# Patient Record
Sex: Female | Born: 1945 | Race: White | Hispanic: No | Marital: Married | Smoking: Former smoker
Health system: Southern US, Community
[De-identification: ages and names within clinical notes are randomized; demographics above are authoritative.]

## PROBLEM LIST (undated history)

## (undated) DIAGNOSIS — M810 Age-related osteoporosis without current pathological fracture: Secondary | ICD-10-CM

## (undated) DIAGNOSIS — J479 Bronchiectasis, uncomplicated: Secondary | ICD-10-CM

## (undated) DIAGNOSIS — E039 Hypothyroidism, unspecified: Secondary | ICD-10-CM

## (undated) DIAGNOSIS — F418 Other specified anxiety disorders: Secondary | ICD-10-CM

## (undated) DIAGNOSIS — G4700 Insomnia, unspecified: Secondary | ICD-10-CM

## (undated) HISTORY — DX: Insomnia, unspecified: G47.00

## (undated) HISTORY — PX: TUBAL LIGATION: SHX77

## (undated) HISTORY — DX: Other specified anxiety disorders: F41.8

## (undated) HISTORY — DX: Hypothyroidism, unspecified: E03.9

## (undated) HISTORY — DX: Bronchiectasis, uncomplicated: J47.9

## (undated) HISTORY — PX: THYROIDECTOMY: SHX17

## (undated) HISTORY — DX: Age-related osteoporosis without current pathological fracture: M81.0

---

## 1999-01-31 HISTORY — PX: SPLENECTOMY: SUR1306

## 1999-07-20 ENCOUNTER — Encounter (INDEPENDENT_AMBULATORY_CARE_PROVIDER_SITE_OTHER): Payer: Self-pay | Admitting: Specialist

## 1999-07-20 ENCOUNTER — Inpatient Hospital Stay (HOSPITAL_COMMUNITY): Admission: RE | Admit: 1999-07-20 | Discharge: 1999-07-22 | Payer: Self-pay | Admitting: Surgery

## 2007-11-14 ENCOUNTER — Ambulatory Visit: Payer: Self-pay | Admitting: Internal Medicine

## 2007-11-14 DIAGNOSIS — J479 Bronchiectasis, uncomplicated: Secondary | ICD-10-CM | POA: Insufficient documentation

## 2007-11-14 DIAGNOSIS — E039 Hypothyroidism, unspecified: Secondary | ICD-10-CM | POA: Insufficient documentation

## 2007-12-17 ENCOUNTER — Ambulatory Visit: Payer: Self-pay | Admitting: Internal Medicine

## 2010-06-17 NOTE — Consult Note (Signed)
[facility]  Patient:    Madison Cox, Madison Cox                         MRN: [id number] Proc. Date: 07/20/99 Attending:  Genene Churn. Cyndie Chime, M.D. CC:         Sandria Bales. Ezzard Standing, M.D.             Dr. Wende Crease, Jonita [place], [place]                          Consultation Report  HISTORY OF PRESENT ILLNESS:  A pleasant 65 year old woman I follow in my [place], [place], office since September of 2000.  She was found to have a low platelet count in January of 2000.  She underwent evaluation by her primary care physician with a bone marrow biopsy on February 12, 1998.  Bone marrow findings were normal and consistent with a diagnosis of ITP.  Platelet count at that time was 7000. She had a prompt response to steroids initially with pc up to 200,000 by February 22, 1998.  However, subsequently she has not been able to get off steroids.  Every time steroids have tapered, platelet count falls.  When she saw me in September of 2000, I added Danazol to the steroids hoping that they would allow me to taper the steroids completely.  Over the last nine months, despite fluctuations of her counts into the normal range I have still not been able to get her completely off steroids.  She has become cushingoid. She is referred at this time for an elective splenectomy.  Current steroid dose is prednisone 20 mg daily and Danazol dose is 200 mg daily.  Most recent CBC done in the [facility] office showed a hemoglobin of 15, hematocrit 46, white count 8.100 and platelet count of 88,000.  In anticipation that surgery was going to be necessary, she has also received Pneumovax, Haemophilus influenzae, and meningococcus vaccines in January of this year.  PAST MEDICAL HISTORY:  This includes Graves disease diagnosed at age 74, initially treated with PTU.  Subsequent subtotal thyroidectomy at [facility] in 1982.  Radioactive iodine treatments given due to regrowth of the thyroid  remnant.  No history of collagen vascular disorder.  MEDICATIONS:  As noted above.  She also takes Synthroid 0.112 mg daily.  FAMILY HISTORY:  Mother has inflammatory arthritis.  A daughter, age 82, developed juvenile diabetes at the time of pregnancy which did not resolve after the pregnancy.  SOCIAL HISTORY:  She is married.  No alcohol use.  She has two children; daughter age 75 with diabetes and son, age 21, who is healthy.  IMPRESSION: 1. 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 2. History of autoimmune thyroiditis.  PLAN:  Proceed with splenectomy.  She should receive stress dose steroids for the first 48 hours.  I would give her Solu-Medrol 60 to 80 mg daily x 3 days and resume her prednisone at 20 mg and then taper the prednisone over the next few weeks in view of her chronic prednisone use.  As noted above, she has received all requisite vaccines back in January.  Thank you for this consultation. DD:  07/20/99 TD:  07/20/99 Job: [id number]

## 2010-06-17 NOTE — Discharge Summary (Signed)
[facility]  Patient:    Madison Cox, Madison Cox                       MRN: [id number] Adm. Date:  [id number] Disc. Date: [id number] Attending:  Andre Lefort CC:         Genene Churn. Cyndie Chime, M.D.  Barbera Setters. Doyne Keel, M.D., [place], [place]   Discharge Summary  DATE OF BIRTH:                [birth year]  DISCHARGE DIAGNOSES:          1. Idiopathic thrombocytopenia.                               2. Autoimmune thyroiditis.  OPERATIONS:                   The patient had a laparoscopic splenectomy on July 20, 1999.  HISTORY OF PRESENT ILLNESS:   Ms. Damico is a 65 year old white female who is followed by Genene Churn. Cyndie Chime, M.D.  She was found to have a low platelet count in January of 2000 and underwent evaluation, which included a bone marrow biopsy on July 14, 1999.  The bone marrow biopsy was normal and consistent with a diagnosis of ITP.  The platelet count at that time was 7000. The patient had a prompt response to steroids where the counts of her platelets got up to as high as 200,000.  However, she since has been unable to be tapered off of steroids.  Every time her steroids have been tapered, her platelet count has fallen dramatically.  Genene Churn. [place]a, M.D., attempted danazol in addition to steroids, but he has continued to be unsuccessful about weaning her off steroids.  She has developed increasing cushingoid features secondary to the steroids. Her admission dose of steroids was prednisone 20 mg daily and danazol 200 mg daily.  Her platelet count was 88,000.  It was felt that she would be best served proceeding with a splenectomy in view of her continued use of steroids and the side effects of these.  She has undergone Pneumovax, Haemophilus influenzae, and meningococcal vaccines in January of 2001 in anticipation of surgery.  PAST MEDICAL HISTORY:  Significant in that she has a Graves disease diagnosed at the age of 51, treated initially with  PTU and subsequently requiring a subtotal thyroidectomy and later radioactive iodine treatment for regrowth of her thyroid tissue remnant.  The remainder of her past medical history is really noncontributory.  PHYSICAL EXAMINATION:  Her abdomen was basically benign with no abdominal mass or splenomegaly.  No evidence of abdominal wall hernia.  HOSPITAL COURSE:  The patient was taken to the operating room on the day of admission where she underwent a laparoscopic splenectomy.  That evening after surgery, she was doing well.  Her hemoglobin was stable.  Her NG tube was removed.  The following, she was started on liquids.  Her serum amylase was 155.  Her platelet count was 156,000 and hemoglobin 12.6.  By the second postoperative day, she was afebrile and her abdomen was soft.  She was scheduled on a taper of her steroids and understood this well.  Her final pathology showed disrupted splenic tissue with hyperplasia of the white pulp, clinically idiopathic thrombocytopenic purpura.  DISCHARGE MEDICATIONS:  She was given Vicodin for pain.  ACTIVITY:  She is to do no driving or heavy lifting  for three or four days.  FOLLOW-UP:  She is to see me back in two weeks.  SPECIAL INSTRUCTIONS:  Genene Churn. Cyndie Chime, M.D., outlined a steroid taper program, which was written out on her discharge instruction sheet, and she was to stop her danazol.  CONDITION ON DISCHARGE:  Good. DD:  10/12/99 TD:  10/14/99 Job: [id number]

## 2012-01-31 HISTORY — PX: COLONOSCOPY: SHX174

## 2016-06-07 ENCOUNTER — Encounter: Payer: Self-pay | Admitting: Hematology

## 2016-07-06 ENCOUNTER — Encounter: Payer: Self-pay | Admitting: Hematology

## 2016-07-06 ENCOUNTER — Ambulatory Visit (HOSPITAL_BASED_OUTPATIENT_CLINIC_OR_DEPARTMENT_OTHER): Payer: Medicare Other

## 2016-07-06 ENCOUNTER — Telehealth: Payer: Self-pay | Admitting: Hematology

## 2016-07-06 ENCOUNTER — Other Ambulatory Visit (HOSPITAL_COMMUNITY)
Admission: RE | Admit: 2016-07-06 | Discharge: 2016-07-06 | Disposition: A | Discharge status: Home / Self Care | Payer: Medicare Other | Source: Ambulatory Visit | Attending: Hematology | Admitting: Hematology

## 2016-07-06 ENCOUNTER — Ambulatory Visit (HOSPITAL_BASED_OUTPATIENT_CLINIC_OR_DEPARTMENT_OTHER): Payer: Medicare Other | Admitting: Hematology

## 2016-07-06 VITALS — BP 117/65 | HR 63 | Temp 97.7°F | Resp 18 | Wt 110.1 lb

## 2016-07-06 DIAGNOSIS — D473 Essential (hemorrhagic) thrombocythemia: Secondary | ICD-10-CM | POA: Diagnosis not present

## 2016-07-06 DIAGNOSIS — D72820 Lymphocytosis (symptomatic): Secondary | ICD-10-CM | POA: Diagnosis not present

## 2016-07-06 DIAGNOSIS — Z862 Personal history of diseases of the blood and blood-forming organs and certain disorders involving the immune mechanism: Secondary | ICD-10-CM | POA: Diagnosis not present

## 2016-07-06 DIAGNOSIS — D75839 Thrombocytosis, unspecified: Secondary | ICD-10-CM

## 2016-07-06 LAB — COMPREHENSIVE METABOLIC PANEL
ALBUMIN: 3.6 g/dL (ref 3.5–5.0)
ALK PHOS: 104 U/L (ref 40–150)
ALT: 16 U/L (ref 0–55)
AST: 25 U/L (ref 5–34)
Anion Gap: 6 mEq/L (ref 3–11)
BILIRUBIN TOTAL: 0.38 mg/dL (ref 0.20–1.20)
BUN: 10.9 mg/dL (ref 7.0–26.0)
CO2: 29 mEq/L (ref 22–29)
CREATININE: 0.9 mg/dL (ref 0.6–1.1)
Calcium: 9.8 mg/dL (ref 8.4–10.4)
Chloride: 104 mEq/L (ref 98–109)
EGFR: 65 mL/min/{1.73_m2} — ABNORMAL LOW (ref >90)
Glucose: 80 mg/dl (ref 70–140)
Potassium: 4.9 mEq/L (ref 3.5–5.1)
SODIUM: 139 meq/L (ref 136–145)
TOTAL PROTEIN: 6.9 g/dL (ref 6.4–8.3)

## 2016-07-06 LAB — CBC & DIFF AND RETIC
BASO%: 0.6 % (ref 0.0–2.0)
Basophils Absolute: 0.1 10*3/uL (ref 0.0–0.1)
EOS ABS: 0.3 10*3/uL (ref 0.0–0.5)
EOS%: 2.6 % (ref 0.0–7.0)
HCT: 41.4 % (ref 34.8–46.6)
HEMOGLOBIN: 13.3 g/dL (ref 11.6–15.9)
IMMATURE RETIC FRACT: 2.8 % (ref 1.60–10.00)
LYMPH#: 5.7 10*3/uL — AB (ref 0.9–3.3)
LYMPH%: 59.4 % — ABNORMAL HIGH (ref 14.0–49.7)
MCH: 28.5 pg (ref 25.1–34.0)
MCHC: 32.1 g/dL (ref 31.5–36.0)
MCV: 88.8 fL (ref 79.5–101.0)
MONO#: 0.6 10*3/uL (ref 0.1–0.9)
MONO%: 5.7 % (ref 0.0–14.0)
NEUT%: 31.7 % — AB (ref 38.4–76.8)
NEUTROS ABS: 3 10*3/uL (ref 1.5–6.5)
PLATELETS: 373 10*3/uL (ref 145–400)
RBC: 4.66 10*6/uL (ref 3.70–5.45)
RDW: 16.2 % — ABNORMAL HIGH (ref 11.2–14.5)
RETIC CT ABS: 25.16 10*3/uL — AB (ref 33.70–90.70)
Retic %: 0.54 % — ABNORMAL LOW (ref 0.70–2.10)
WBC: 9.6 10*3/uL (ref 3.9–10.3)

## 2016-07-06 LAB — CHCC SMEAR

## 2016-07-06 LAB — LACTATE DEHYDROGENASE: LDH: 172 U/L (ref 125–245)

## 2016-07-06 NOTE — Telephone Encounter (Signed)
Gave patient AVS and calender per 6/7 los . 

## 2016-07-06 NOTE — Progress Notes (Signed)
[facility]    HEMATOLOGY/ONCOLOGY CONSULTATION NOTE  Date of Service: 07/06/2016  Patient Care Team: Glenda Chroman, MD as PCP - General (Internal Medicine)  CHIEF COMPLAINTS/PURPOSE OF CONSULTATION:  Thrombocytosis  HISTORY OF PRESENTING ILLNESS:   Madison Cox is a wonderful 71 y.o. female who has been referred to [place] by Dr .Woody Seller, Costella Hatcher, MD  for evaluation and management of thrombocytosis.  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 He reasonably groomed laboratory primary care physician on 05/27/2016 which showed a normal hemoglobin of 13.5 with an MCV of 86. He was noted to have elevated platelet count of 717k and a borderline elevated WBC count of 11.8k with lymphocyte count of 5.6k. Labs prior to these on 04/20/2016 showed a platelet count of 601 k and lymphocyte count of 6.6 k CBC on day/06/2016 showed a platelet count of 467 k with lymphocyte count of 6.1 k and monocyte count of 1.3 k.  Previous CBC on 03/24/2015 show normal platelet count of 367 k with lymphocyte count of 4.9k. And a platelet count of 415k in February 2016. Platelet count of 526k in February 2015.  Patient has no history of DVT, MI or stroke . No current smoking.   no acute infections or recent surgical stressors.   no fevers no chills no night sweats. No clinical evidence of an enlarged lymph nodes. No shortness of breath no chest pain no abdominal pain or distention. No significant unexpected weight loss.      MEDICAL HISTORY:   #1 history of ITP 13 years ago -treated with steroids and splenectomy. #2 history of Graves' disease status post thyroidectomy and radioactive iodine 1 - now hypothyroid on levothyroxine replacement. #3 depression #4 anxiety #5 asthmatic bronchitis #6 osteoporosis  SURGICAL HISTORY:  #1 splenectomy 13 years ago #2  thyroidectomy #3 tubal ligation  SOCIAL HISTORY: Social History   Social History  . Marital status: Married    Spouse name: N/A  . Number of children: N/A  . Years of education: N/A   Occupational History  . Not on file.   Social History Main Topics  . Smoking status: Not on file  . Smokeless tobacco: Not on file  . Alcohol use Not on file  . Drug use: Unknown  . Sexual activity: Not on file   Other Topics Concern  . Not on file   Social History Narrative  . No narrative on file  Retired from [employer] with spouse, widowed, remarried Quit tobacco use in 2005 No significant alcohol use No drug use  FAMILY HISTORY:  Father-kidney cancer Daughter-diabetes type 2 No family history of blood disorders or cancers.  ALLERGIES:  has no allergies on file.  MEDICATIONS:  Current Outpatient Prescriptions  Medication Sig Dispense Refill  . ALPRAZolam (XANAX) 0.5 MG tablet Take 0.5 mg by mouth 2 (two) times daily as needed.  1  . Calcium Carb-Cholecalciferol (CALCIUM 1000 + D PO) Take by mouth.    [facility] Kitchen FLUoxetine (PROZAC) 20 MG capsule TAKE 2 CAPSULES BY MOUTH DAILY FOR DEPRESSION.  12  . ibandronate (BONIVA) 150 MG tablet TAKE 1 TABLET BY MOUTH EVERY MONTH  12  . levothyroxine (SYNTHROID, LEVOTHROID) 88 MCG tablet Take 88 mcg by mouth daily.  2  . Multiple Vitamins-Minerals (MULTIVITAMIN ADULT PO) Take by mouth.     No current facility-administered medications for this visit.  REVIEW OF SYSTEMS:    10 Point review of Systems was done is negative except as noted above.  PHYSICAL EXAMINATION: ECOG PERFORMANCE STATUS: 1 - Symptomatic but completely ambulatory  . Vitals:   07/06/16 1248  BP: 117/65  Pulse: 63  Resp: 18  Temp: 97.7 F (36.5 C)   Filed Weights   07/06/16 1248  Weight: 110 lb 1.6 oz (49.9 kg)   .There is no height or weight on file to calculate BMI.  GENERAL:alert, in no acute distress and comfortable SKIN: no acute rashes, no  significant lesions EYES: conjunctiva are pink and non-injected, sclera anicteric OROPHARYNX: MMM, no exudates, no oropharyngeal erythema or ulceration NECK: supple, no JVD LYMPH:  no palpable lymphadenopathy in the cervical, axillary or inguinal regions LUNGS: clear to auscultation b/l with normal respiratory effort HEART: regular rate & rhythm ABDOMEN:  normoactive bowel sounds , non tender, not distended. No palpable hepatomegaly Extremity: no pedal edema PSYCH: alert & oriented x 3 with fluent speech NEURO: no focal motor/sensory deficits  LABORATORY DATA:  I have reviewed the data as listed  . CBC Latest Ref Rng & Units 07/06/2016  WBC 3.9 - 10.3 10e3/uL 9.6  Hemoglobin 11.6 - 15.9 g/dL 13.3  Hematocrit 34.8 - 46.6 % 41.4  Platelets 145 - 400 10e3/uL 373   . CBC    Component Value Date/Time   WBC 9.6 07/06/2016 1337   RBC 4.66 07/06/2016 1337   HGB 13.3 07/06/2016 1337   HCT 41.4 07/06/2016 1337   PLT 373 07/06/2016 1337   MCV 88.8 07/06/2016 1337   MCH 28.5 07/06/2016 1337   MCHC 32.1 07/06/2016 1337   RDW 16.2 (H) 07/06/2016 1337   LYMPHSABS 5.7 (H) 07/06/2016 1337   MONOABS 0.6 07/06/2016 1337   EOSABS 0.3 07/06/2016 1337   BASOSABS 0.1 07/06/2016 1337    . CMP Latest Ref Rng & Units 07/06/2016  Glucose 70 - 140 mg/dl 80  BUN 7.0 - 26.0 mg/dL 10.9  Creatinine 0.6 - 1.1 mg/dL 0.9  Sodium 136 - 145 mEq/L 139  Potassium 3.5 - 5.1 mEq/L 4.9  CO2 22 - 29 mEq/L 29  Calcium 8.4 - 10.4 mg/dL 9.8  Total Protein 6.4 - 8.3 g/dL 6.9  Total Bilirubin 0.20 - 1.20 mg/dL 0.38  Alkaline Phos 40 - 150 U/L 104  AST 5 - 34 U/L 25  ALT 0 - 55 U/L 16     RADIOGRAPHIC STUDIES: I have personally reviewed the radiological images as listed and agreed with the findings in the report. No results found.  ASSESSMENT & PLAN:   71 yo female with   1) H/o ITP  status post splenectomy in June 2001 .  2) Chronic variably elevated platelet counts/Thrombocytosis for several year.  This appears to be likely related to the patient's post splenectomy status. Less likely to be a myeloproliferative neoplasm such as essential thrombocytosis But would rule this out. Associated mild lymphocytosis and at times monocytosis is consistent with her post-splenectomy status.  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 Rule out clonal lymphocytosis. Lymphocyte count of 5.7k Plan -Potential etiologies of the patient's thrombocytosis were discussed. We discussed that the most likely etiology would be her post-splenectomy status. -will get rpt labs today. CBC done today show nl platelet count of 373K -genetics markers for clonal thrombocytosis were sent out. -flow cytometry on peripheral blood to evaluate for clonal lymphocytosis.  Labs today RTC with Dr Irene Limbo in 2 weeks   . Orders Placed This Encounter  Procedures  . CBC & Diff and Retic    Standing Status:   Future    Number of Occurrences:   1    Standing Expiration Date:   07/06/2017  . Comprehensive metabolic panel    Standing Status:   Future    Number of Occurrences:   1    Standing Expiration Date:   07/06/2017  . Lactate dehydrogenase    Standing Status:   Future    Number of Occurrences:   1    Standing Expiration Date:   07/06/2017  . Flow Cytometry    Lymphocytosis r/o clonal lymphoproliferative disorder    Standing Status:   Future    Number of Occurrences:   1    Standing Expiration Date:   07/06/2017  . JAK2 (including V617F and Exon 12), MPL, and CALR-Next Generation Sequencing    Standing Status:   Future    Number of Occurrences:   1    Standing Expiration Date:   07/06/2017  . Smear    Standing Status:   Future    Number of Occurrences:   1    Standing Expiration Date:   07/06/2017     All of the patients questions were answered with apparent satisfaction. The patient knows to call the clinic with any problems, questions or concerns.  I spent 40 minutes counseling the patient face to  face. The total time spent in the appointment was 60 minutes and more than 50% was on counseling and direct patient cares.    Sullivan Lone MD [place] AAHIVMS [facility] Hematology/Oncology Physician [facility]  (Office):       [phone] (Work cell):  [phone] (Fax):           [phone]  07/06/2016 12:56 PM

## 2016-07-06 NOTE — Patient Instructions (Signed)
Thank you for choosing [facility] to provide your oncology and hematology care.  To afford each patient quality time with our providers, please arrive 30 minutes before your scheduled appointment time.  If you arrive late for your appointment, you may be asked to reschedule.  We strive to give you quality time with our providers, and arriving late affects you and other patients whose appointments are after yours.   If you are a no show for multiple scheduled visits, you may be dismissed from the clinic at the providers discretion.    Again, thank you for choosing [facility], our hope is that these requests will decrease the amount of time that you wait before being seen by our physicians.  ______________________________________________________________________  Should you have questions after your visit to the [facility], please contact our office at [phone] between the hours of 8:30 and 4:30 p.m.    Voicemails left after 4:30p.m will not be returned until the following business day.    For prescription refill requests, please have your pharmacy contact us directly.  Please also try to allow 48 hours for prescription requests.    Please contact the scheduling department for questions regarding scheduling.  For scheduling of procedures such as PET scans, CT scans, MRI, Ultrasound, etc please contact central scheduling at [phone].    Resources For Cancer Patients and Caregivers:   Oncolink.org:  A wonderful resource for patients and healthcare providers for information regarding your disease, ways to tract your treatment, what to expect, etc.     American Cancer Society:  800-227-2345  Can help patients locate various types of support and financial assistance  Cancer Care: 1-800-813-HOPE (4673) Provides financial assistance, online support groups, medication/co-pay assistance.    [place] DSS:  336-641-3447 Where to apply for food  stamps, Medicaid, and utility assistance  Medicare Rights Center: 800-333-4114 Helps people with Medicare understand their rights and benefits, navigate the Medicare system, and secure the quality healthcare they deserve  SCAT: 336-333-6589 [place] [employer]'s shared-ride transportation service for eligible riders who have a disability that prevents them from riding the fixed route bus.    For additional information on assistance programs please contact our social worker:   Grier Hock/Abigail Elmore:  [phone]            

## 2016-07-10 LAB — FLOW CYTOMETRY

## 2016-07-18 ENCOUNTER — Ambulatory Visit (HOSPITAL_BASED_OUTPATIENT_CLINIC_OR_DEPARTMENT_OTHER): Payer: Medicare Other | Admitting: Hematology

## 2016-07-18 ENCOUNTER — Telehealth: Payer: Self-pay | Admitting: Hematology

## 2016-07-18 VITALS — BP 91/55 | HR 59 | Temp 97.5°F | Resp 18 | Wt 110.6 lb

## 2016-07-18 DIAGNOSIS — D72820 Lymphocytosis (symptomatic): Secondary | ICD-10-CM | POA: Diagnosis not present

## 2016-07-18 DIAGNOSIS — Z862 Personal history of diseases of the blood and blood-forming organs and certain disorders involving the immune mechanism: Secondary | ICD-10-CM

## 2016-07-18 DIAGNOSIS — D75839 Thrombocytosis, unspecified: Secondary | ICD-10-CM

## 2016-07-18 DIAGNOSIS — D473 Essential (hemorrhagic) thrombocythemia: Secondary | ICD-10-CM

## 2016-07-18 NOTE — Telephone Encounter (Signed)
No additional appts added per 6/19 los. -RTC with Dr Irene Limbo on an as needed basis.

## 2016-08-28 NOTE — Progress Notes (Signed)
Marland Kitchen    HEMATOLOGY/ONCOLOGY CLINIC NOTE  Date of Service: .07/18/2016  Patient Care Team: Glenda Chroman, MD as PCP - General (Internal Medicine)  CHIEF COMPLAINTS/PURPOSE OF CONSULTATION:  Thrombocytosis  HISTORY OF PRESENTING ILLNESS:   Madison Cox is a wonderful 71 y.o. female who has been referred to [place] by Dr .Woody Seller, Costella Hatcher, MD  for evaluation and management of thrombocytosis.  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 He reasonably groomed laboratory primary care physician on 05/27/2016 which showed a normal hemoglobin of 13.5 with an MCV of 86. He was noted to have elevated platelet count of 717k and a borderline elevated WBC count of 11.8k with lymphocyte count of 5.6k. Labs prior to these on 04/20/2016 showed a platelet count of 601 k and lymphocyte count of 6.6 k CBC on day/06/2016 showed a platelet count of 467 k with lymphocyte count of 6.1 k and monocyte count of 1.3 k.  Previous CBC on 03/24/2015 show normal platelet count of 367 k with lymphocyte count of 4.9k. And a platelet count of 415k in February 2016. Platelet count of 526k in February 2015.  Patient has no history of DVT, MI or stroke . No current smoking.   no acute infections or recent surgical stressors.   no fevers no chills no night sweats. No clinical evidence of an enlarged lymph nodes. No shortness of breath no chest pain no abdominal pain or distention. No significant unexpected weight loss.     INTERVAL HISTORY  Patient is for follow-up to review the results of her workup for thrombocytopenia and lymphocytosis. She notes no acute new symptoms. No fevers no chills no night sweats. Lab results were discussed in details and all her questions were answered to her apparent satisfaction.  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 #2 history of Graves' disease status post thyroidectomy and radioactive iodine 1 - now hypothyroid on levothyroxine replacement. #3 depression #4 anxiety #5 asthmatic bronchitis #6 osteoporosis  SURGICAL HISTORY:  #1 splenectomy 13 years ago #2 thyroidectomy #3 tubal ligation  SOCIAL HISTORY: Social History   Social History  . Marital status: Married    Spouse name: N/A  . Number of children: N/A  . Years of education: N/A   Occupational History  . Not on file.   Social History Main Topics  . Smoking status: Former Smoker    Types: Cigarettes    Quit date: 07/07/2003  . Smokeless tobacco: Never Used  . Alcohol use No  . Drug use: No  . Sexual activity: Not on file   Other Topics Concern  . Not on file   Social History Narrative  . No narrative on file  Retired from [employer] with spouse, widowed, remarried Quit tobacco use in 2005 No significant alcohol use No drug use  FAMILY HISTORY:  Father-kidney cancer Daughter-diabetes type 2 No family history of blood disorders or cancers.  ALLERGIES:  has No Known Allergies.  MEDICATIONS:  Current Outpatient Prescriptions  Medication Sig Dispense Refill  . ALPRAZolam (XANAX) 0.5 MG tablet Take 0.5 mg by mouth 2 (two) times daily as needed.  1  . Calcium Carb-Cholecalciferol (CALCIUM 1000 + D PO) Take by mouth.    Marland Kitchen FLUoxetine (PROZAC) 20 MG capsule TAKE 2 CAPSULES BY MOUTH DAILY FOR DEPRESSION.  12  .  ibandronate (BONIVA) 150 MG tablet TAKE 1 TABLET BY MOUTH EVERY MONTH  12  . levothyroxine (SYNTHROID, LEVOTHROID) 88 MCG tablet Take 88 mcg by mouth daily.  2  . Multiple Vitamins-Minerals (MULTIVITAMIN ADULT PO) Take by mouth.     No current facility-administered medications for this visit.     REVIEW OF SYSTEMS:    10 Point review of Systems was done is negative except as noted above.  PHYSICAL EXAMINATION: ECOG PERFORMANCE STATUS: 1 - Symptomatic but  completely ambulatory  . Vitals:   07/18/16 1410  BP: (!) 91/55  Pulse: (!) 59  Resp: 18  Temp: 97.5 F (36.4 C)   Filed Weights   07/18/16 1410  Weight: 110 lb 9.6 oz (50.2 kg)   .There is no height or weight on file to calculate BMI.  GENERAL:alert, in no acute distress and comfortable SKIN: no acute rashes, no significant lesions EYES: conjunctiva are pink and non-injected, sclera anicteric OROPHARYNX: MMM, no exudates, no oropharyngeal erythema or ulceration NECK: supple, no JVD LYMPH:  no palpable lymphadenopathy in the cervical, axillary or inguinal regions LUNGS: clear to auscultation b/l with normal respiratory effort HEART: regular rate & rhythm ABDOMEN:  normoactive bowel sounds , non tender, not distended. No palpable hepatomegaly Extremity: no pedal edema PSYCH: alert & oriented x 3 with fluent speech NEURO: no focal motor/sensory deficits  LABORATORY DATA:  I have reviewed the data as listed  . CBC Latest Ref Rng & Units 07/06/2016  WBC 3.9 - 10.3 10e3/uL 9.6  Hemoglobin 11.6 - 15.9 g/dL 13.3  Hematocrit 34.8 - 46.6 % 41.4  Platelets 145 - 400 10e3/uL 373   . CBC    Component Value Date/Time   WBC 9.6 07/06/2016 1337   RBC 4.66 07/06/2016 1337   HGB 13.3 07/06/2016 1337   HCT 41.4 07/06/2016 1337   PLT 373 07/06/2016 1337   MCV 88.8 07/06/2016 1337   MCH 28.5 07/06/2016 1337   MCHC 32.1 07/06/2016 1337   RDW 16.2 (H) 07/06/2016 1337   LYMPHSABS 5.7 (H) 07/06/2016 1337   MONOABS 0.6 07/06/2016 1337   EOSABS 0.3 07/06/2016 1337   BASOSABS 0.1 07/06/2016 1337    . CMP Latest Ref Rng & Units 07/06/2016  Glucose 70 - 140 mg/dl 80  BUN 7.0 - 26.0 mg/dL 10.9  Creatinine 0.6 - 1.1 mg/dL 0.9  Sodium 136 - 145 mEq/L 139  Potassium 3.5 - 5.1 mEq/L 4.9  CO2 22 - 29 mEq/L 29  Calcium 8.4 - 10.4 mg/dL 9.8  Total Protein 6.4 - 8.3 g/dL 6.9  Total Bilirubin 0.20 - 1.20 mg/dL 0.38  Alkaline Phos 40 - 150 U/L 104  AST 5 - 34 U/L 25  ALT 0 - 55 U/L 16        RADIOGRAPHIC STUDIES: I have personally reviewed the radiological images as listed and agreed with the findings in the report. No results found.  ASSESSMENT & PLAN:   71 yo female with   1) H/o ITP  status post splenectomy in June 2001 .  2) Chronic variably elevated platelet counts/Thrombocytosis for several years. Thisis likely related to the patient's post splenectomy status. Testing for clonal thrombocytosis including JAK2 (including V617F and Exon 12), MPL, and CALR was negative ruling out the possibility of Essential thrombocytosis.  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  Lymphocyte count of 5.7k Flow cytometry - did not show an overt clonal lymphocyte population. Plan -all the lab results were discussed in details with the patient  and all her questions were answered . -Findings are consistent with reactive thrombocytosis and mild chronic lymphocytosis related to post splenectomy status -Reasonable to continue aspirin 81 mg by mouth daily . -No other additional workup or therapeutic intervention recommended at this time . -We discussed that the platelet counts currently are in the normal range. They're likely to increase excessively with surgeries infections or other reactive stimuli . -Continue follow-up with primary care physician .    RTC with Dr Irene Limbo on an as-needed basis if any new questions or concerns arise.  No orders of the defined types were placed in this encounter.    All of the patients questions were answered with apparent satisfaction. The patient knows to call the clinic with any problems, questions or concerns.  I spent 20 minutes counseling the patient face to face. The total time spent in the appointment was 25 minutes and more than 50% was on counseling and direct patient cares.    Sullivan Lone MD [place] AAHIVMS [facility] Hematology/Oncology Physician [facility]  (Office):       [phone] (Work  cell):  [phone] (Fax):           [phone]

## 2018-11-27 ENCOUNTER — Ambulatory Visit (INDEPENDENT_AMBULATORY_CARE_PROVIDER_SITE_OTHER): Payer: Medicare Other

## 2018-11-27 ENCOUNTER — Institutional Professional Consult (permissible substitution): Payer: Medicare Other | Admitting: Internal Medicine

## 2018-11-27 ENCOUNTER — Ambulatory Visit: Payer: Medicare Other | Admitting: Pulmonary Disease

## 2018-11-27 ENCOUNTER — Other Ambulatory Visit: Payer: Self-pay

## 2018-11-27 ENCOUNTER — Encounter: Payer: Self-pay | Admitting: Pulmonary Disease

## 2018-11-27 VITALS — BP 116/64 | HR 69 | Temp 97.3°F | Ht 62.0 in | Wt 116.2 lb

## 2018-11-27 DIAGNOSIS — J479 Bronchiectasis, uncomplicated: Secondary | ICD-10-CM

## 2018-11-27 LAB — COMPREHENSIVE METABOLIC PANEL
ALT: 13 U/L (ref 0–35)
AST: 21 U/L (ref 0–37)
Albumin: 4.1 g/dL (ref 3.5–5.2)
Alkaline Phosphatase: 76 U/L (ref 39–117)
BUN: 14 mg/dL (ref 6–23)
CO2: 28 mEq/L (ref 19–32)
Calcium: 9.1 mg/dL (ref 8.4–10.5)
Chloride: 101 mEq/L (ref 96–112)
Creatinine, Ser: 0.91 mg/dL (ref 0.40–1.20)
GFR: 60.73 mL/min (ref >60.00)
Glucose, Bld: 86 mg/dL (ref 70–99)
Potassium: 3.9 mEq/L (ref 3.5–5.1)
Sodium: 139 mEq/L (ref 135–145)
Total Bilirubin: 0.5 mg/dL (ref 0.2–1.2)
Total Protein: 7 g/dL (ref 6.0–8.3)

## 2018-11-27 LAB — CBC WITH DIFFERENTIAL/PLATELET
Basophils Absolute: 0.1 10*3/uL (ref 0.0–0.1)
Basophils Relative: 1.3 % (ref 0.0–3.0)
Eosinophils Absolute: 0.5 10*3/uL (ref 0.0–0.7)
Eosinophils Relative: 5.2 % — ABNORMAL HIGH (ref 0.0–5.0)
HCT: 41 % (ref 36.0–46.0)
Hemoglobin: 13.4 g/dL (ref 12.0–15.0)
Lymphocytes Relative: 53.2 % — ABNORMAL HIGH (ref 12.0–46.0)
Lymphs Abs: 4.7 10*3/uL — ABNORMAL HIGH (ref 0.7–4.0)
MCHC: 32.6 g/dL (ref 30.0–36.0)
MCV: 90.8 fl (ref 78.0–100.0)
Monocytes Absolute: 0.7 10*3/uL (ref 0.1–1.0)
Monocytes Relative: 7.7 % (ref 3.0–12.0)
Neutro Abs: 2.9 10*3/uL (ref 1.4–7.7)
Neutrophils Relative %: 32.6 % — ABNORMAL LOW (ref 43.0–77.0)
Platelets: 439 10*3/uL — ABNORMAL HIGH (ref 150.0–400.0)
RBC: 4.52 Mil/uL (ref 3.87–5.11)
RDW: 14 % (ref 11.5–15.5)
WBC: 8.8 10*3/uL (ref 4.0–10.5)

## 2018-11-27 NOTE — Progress Notes (Signed)
  Subjective:     Patient ID: Madison Cox, female   DOB: February 19, 1945, 73 y.o.   MRN: [id number]  HPI   Review of Systems  Constitutional: Positive for fatigue.  HENT: Positive for congestion, drooling, postnasal drip, sinus pressure and sinus pain.   Eyes: Positive for itching.  Respiratory: Positive for wheezing.   Cardiovascular: Negative.   Gastrointestinal: Positive for constipation.  Endocrine: Negative.   Genitourinary: Negative.   Musculoskeletal: Negative.   Skin: Negative.   Allergic/Immunologic: Negative.   Neurological: Negative.   Hematological: Bruises/bleeds easily.  Psychiatric/Behavioral: Negative.        Objective:   Physical Exam     Assessment:         Plan:

## 2018-11-27 NOTE — Patient Instructions (Signed)
Chest xray and lab tests today  Will schedule pulmonary function test   Follow up in 2 weeks

## 2018-11-27 NOTE — Progress Notes (Signed)
[place] [facility], Critical Care, and Sleep Medicine  Chief Complaint  Patient presents with  . Consult    Bronchiectasis per referral from Dr. Woody Seller    Constitutional:  BP 116/64 (BP Location: Right Arm, Patient Position: Sitting, Cuff Size: Normal)   Pulse 69   Temp (!) 97.3 F (36.3 C)   Ht 5\' 2"  (1.575 m)   Wt 116 lb 3.2 oz (52.7 kg)   SpO2 99% Comment: on room air  BMI 21.25 kg/m   Past Medical History:  Depression, Anxiety, Hypothyroidism  Brief Summary:  Madison Cox is a 73 y.o. female former smoker with cough.  Seen in 2009 by Dr. Melvyn Novas.  Quit smoking in 2005.  Smoked 0.5 to 1 ppd for 35 yrs.  Report from CT chest 11/04/07 showed RUL and RML BTX.  Her mother has respiratory issues and uses oxygen.    She gets cough with chest congestion.  This comes from her sinus and her chest.  She has coughed up blood before, but not in the past year.  Also gets intermittent chest tightness and wheezing.  Sputum otherwise is usually clear.  Not having fever, sweats, weight loss, skin rash, joint swelling, or gland swelling.  Does get acid reflux intermittently.  Her cough seems to be worse at night.    She is from [place].  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  Has a pet cat.  No history of pneumonia or exposure to TB.   Physical Exam:   Appearance - well kempt   ENMT - clear nasal mucosa, midline nasal  septum, no oral exudates, no LAN, trachea midline  Respiratory - normal chest wall, normal respiratory effort, no accessory muscle use, no wheeze/rales  CV - s1s2 regular rate and rhythm, no murmurs, no peripheral edema, radial pulses symmetric  GI - soft, non tender, no masses  Lymph - no adenopathy noted in neck and axillary areas  MSK - normal gait  Ext - no cyanosis, clubbing, or joint inflammation noted  Skin - no rashes, lesions, or ulcers  Neuro - normal strength, oriented x 3  Psych - normal mood and affect   Assessment/Plan:    Chronic cough with post nasal drip with history of bronchiectasis. - will check CMET, CBC with diff, RAST with IgE, CXR, and PFT - can use mucinex prn  - defer additional therapeutics until additional testing completed  GERD. - might be a contributor to her bronchiectasis - will assess further at next follow up    Patient Instructions  Chest xray and lab tests today  Will schedule pulmonary function test   Follow up in 2 weeks    Chesley Mires, MD [place] Pager: [phone] 11/27/2018, 12:30 PM  Flow Sheet     Pulmonary tests:    Review of Systems:  Constitutional: Positive for fatigue.  HENT: Positive for congestion, drooling, postnasal drip, sinus pressure and sinus pain.   Eyes: Positive for itching.  Respiratory: Positive for wheezing.   Cardiovascular: Negative.   Gastrointestinal: Positive for constipation.  Endocrine: Negative.   Genitourinary: Negative.   Musculoskeletal: Negative.   Skin: Negative.   Allergic/Immunologic: Negative.   Neurological: Negative.   Hematological: Bruises/bleeds easily.  Psychiatric/Behavioral: Negative.    Medications:   Allergies as of 11/27/2018   No Known Allergies     Medication List       Accurate as of November 27, 2018 12:30 PM. If you have any questions, ask your nurse  or doctor.        STOP taking these medications   ibandronate 150 MG tablet Commonly known as: BONIVA Stopped by: Chesley Mires, MD     TAKE these medications   ALPRAZolam 0.5 MG tablet Commonly known as: XANAX Take 0.5 mg by mouth 2 (two) times daily as needed.   CALCIUM 1000 + D PO Take by mouth.   FLUoxetine 20 MG capsule Commonly known as: PROZAC TAKE 2 CAPSULES BY MOUTH DAILY FOR DEPRESSION.   levothyroxine 100 MCG tablet Commonly known as: SYNTHROID Take 88 mcg by mouth daily.   MULTIVITAMIN ADULT PO Take by mouth.       Past Surgical History:  She  has a past surgical history that includes  Colonoscopy (2014); Splenectomy (2001); Thyroidectomy; and Tubal ligation.  Family History:  Her family history includes Liver cancer in her father.  Social History:  She  reports that she quit smoking about 15 years ago. Her smoking use included cigarettes. She has never used smokeless tobacco. She reports that she does not drink alcohol or use drugs.

## 2018-11-27 NOTE — Addendum Note (Signed)
Addended by: Suzzanne Cloud E on: 11/27/2018 12:34 PM   Modules accepted: Orders

## 2018-11-30 LAB — ALLERGEN PROFILE, PERENNIAL ALLERGEN IGE

## 2018-12-02 ENCOUNTER — Telehealth: Payer: Self-pay | Admitting: Pulmonary Disease

## 2018-12-02 DIAGNOSIS — J471 Bronchiectasis with (acute) exacerbation: Secondary | ICD-10-CM

## 2018-12-02 DIAGNOSIS — J479 Bronchiectasis, uncomplicated: Secondary | ICD-10-CM

## 2018-12-02 NOTE — Telephone Encounter (Signed)
CMP Latest Ref Rng & Units 11/27/2018 07/06/2016  Glucose 70 - 99 mg/dL 86 80  BUN 6 - 23 mg/dL 14 10.9  Creatinine 0.40 - 1.20 mg/dL 0.91 0.9  Sodium 135 - 145 mEq/L 139 139  Potassium 3.5 - 5.1 mEq/L 3.9 4.9  Chloride 96 - 112 mEq/L 101 -  CO2 19 - 32 mEq/L 28 29  Calcium 8.4 - 10.5 mg/dL 9.1 9.8  Total Protein 6.0 - 8.3 g/dL 7.0 6.9  Total Bilirubin 0.2 - 1.2 mg/dL 0.5 0.38  Alkaline Phos 39 - 117 U/L 76 104  AST 0 - 37 U/L 21 25  ALT 0 - 35 U/L 13 16   CBC    Component Value Date/Time   WBC 8.8 11/27/2018 1235   RBC 4.52 11/27/2018 1235   HGB 13.4 11/27/2018 1235   HGB 13.3 07/06/2016 1337   HCT 41.0 11/27/2018 1235   HCT 41.4 07/06/2016 1337   PLT 439.0 (H) 11/27/2018 1235   PLT 373 07/06/2016 1337   MCV 90.8 11/27/2018 1235   MCV 88.8 07/06/2016 1337   MCH 28.5 07/06/2016 1337   MCHC 32.6 11/27/2018 1235   RDW 14.0 11/27/2018 1235   RDW 16.2 (H) 07/06/2016 1337   LYMPHSABS 4.7 (H) 11/27/2018 1235   LYMPHSABS 5.7 (H) 07/06/2016 1337   MONOABS 0.7 11/27/2018 1235   MONOABS 0.6 07/06/2016 1337   EOSABS 0.5 11/27/2018 1235   EOSABS 0.3 07/06/2016 1337   BASOSABS 0.1 11/27/2018 1235   BASOSABS 0.1 07/06/2016 1337    RAST 11/27/18 >> negative   CLINICAL DATA:  Cough, bronchiectasis  EXAM: CHEST - 2 VIEW  COMPARISON:  12/17/2007 chest radiograph.  FINDINGS: Stable cardiomediastinal silhouette with normal heart size. No pneumothorax. No pleural effusion. No pulmonary edema. No acute consolidative airspace disease. Scattered tram track opacities throughout both lungs, most prominent in the right middle lobe and lingula, appearing slightly worsened since 02/15/2017. Hyperinflated lungs. Chronic lower thoracic vertebral compression fracture.  IMPRESSION: 1. No acute consolidative airspace disease to suggest a pneumonia. 2. Scattered tram track opacities in both lungs, most prominent in the right middle lobe and lingula, appearing slightly  worsened, compatible with the reported history of bronchiectasis. High-resolution chest CT study may be obtained for further evaluation as clinically warranted. 3. Hyperinflated lungs, unchanged, suggesting chronic obstructive lung disease.   Electronically Signed   By: Ilona Sorrel M.D.   On: 11/27/2018 17:04   Please let her know that lab tests were normal.  Her chest xray shows progressive changes of bronchiectasis in middle parts of her lungs.  Please arrange for high resolution CT chest to further assess.  She has follow up appointment with me on 12/17/18 - please try to arrange for HRCT chest to be done before her ROV.

## 2018-12-02 NOTE — Telephone Encounter (Signed)
I called pt back to let her know we are doing the precert

## 2018-12-02 NOTE — Telephone Encounter (Signed)
Called the patient and advised her of the results noted below. Patient agreed to high resolution chest CT, but she will not be able to do it on November 12th, 13th or 14th. Advised the patient she will be contacted by [facility] to schedule. Patient voiced understanding. Nothing further needed.

## 2018-12-06 ENCOUNTER — Ambulatory Visit (HOSPITAL_COMMUNITY)
Admission: RE | Admit: 2018-12-06 | Discharge: 2018-12-06 | Disposition: A | Discharge status: Home / Self Care | Payer: Medicare Other | Source: Ambulatory Visit | Attending: Pulmonary Disease | Admitting: Pulmonary Disease

## 2018-12-06 ENCOUNTER — Other Ambulatory Visit: Payer: Self-pay

## 2018-12-06 DIAGNOSIS — J471 Bronchiectasis with (acute) exacerbation: Secondary | ICD-10-CM | POA: Insufficient documentation

## 2018-12-06 DIAGNOSIS — J479 Bronchiectasis, uncomplicated: Secondary | ICD-10-CM | POA: Insufficient documentation

## 2018-12-17 ENCOUNTER — Ambulatory Visit: Payer: Medicare Other | Admitting: Pulmonary Disease

## 2018-12-17 ENCOUNTER — Encounter: Payer: Self-pay | Admitting: Pulmonary Disease

## 2018-12-17 ENCOUNTER — Other Ambulatory Visit: Payer: Self-pay

## 2018-12-17 VITALS — BP 106/62 | HR 76 | Temp 97.0°F | Ht 63.58 in | Wt 116.8 lb

## 2018-12-17 DIAGNOSIS — R05 Cough: Secondary | ICD-10-CM

## 2018-12-17 DIAGNOSIS — J432 Centrilobular emphysema: Secondary | ICD-10-CM

## 2018-12-17 DIAGNOSIS — R058 Other specified cough: Secondary | ICD-10-CM

## 2018-12-17 DIAGNOSIS — K219 Gastro-esophageal reflux disease without esophagitis: Secondary | ICD-10-CM

## 2018-12-17 DIAGNOSIS — J479 Bronchiectasis, uncomplicated: Secondary | ICD-10-CM | POA: Diagnosis not present

## 2018-12-17 MED ORDER — FLUTICASONE PROPIONATE 50 MCG/ACT NA SUSP: 1.0000 | Freq: Every day | NASAL | 2 refills | Status: DC

## 2018-12-17 NOTE — Patient Instructions (Signed)
Fluticasone (flonase) 1 spray in each nostril for two weeks, then as needed  Sip water when you have the urge to cough  Take 1 teaspoon of honey a day when you have a cough and throat irritation  Salt water gargles once or twice per day  Avoid forcing a cough or clearing your throat  Follow up in 2 months after your pulmonary function test is done

## 2018-12-17 NOTE — Progress Notes (Signed)
[place] [facility], Critical Care, and Sleep Medicine  Chief Complaint  Patient presents with  . Follow-up    results of high resolution CT scan    Constitutional:  BP 106/62 (BP Location: Right Arm, Cuff Size: Normal)   Pulse 76   Temp (!) 97 F (36.1 C) (Temporal)   Ht 5' 3.58" (1.615 m) Comment: with shoes  Wt 116 lb 12.8 oz (53 kg)   SpO2 99%   BMI 20.31 kg/m   Past Medical History:  Depression, Anxiety, Hypothyroidism, Splenectomy, hiatal hernia  Brief Summary:  Madison Cox is a 73 y.o. female former smoker with cough from bronchiectasis, emphysema, post nasal drip and reflux.  She had HRCT.  Shows stable changes of BTX.  Also mild emphysema.  She has nasal congestion and post nasal drip.  Also has reflux.  Gets throat irritation and then dry cough.  Has to clear her throat intermittently.  Not having chest congestion, wheeze, fever, or hemoptysis.   Physical Exam:   Appearance - well kempt   ENMT - no sinus tenderness, mild septal deviation, clear nasal discharge, no oral exudate  Neck - no masses, trachea midline, no thyromegaly, no elevation in JVP  Respiratory - normal appearance of chest wall, normal respiratory effort w/o accessory muscle use, no dullness on percussion, no wheezing or rales  CV - s1s2 regular rate and rhythm, no murmurs, no peripheral edema, radial pulses symmetric  GI - soft, non tender  Lymph - no adenopathy noted in neck and axillary areas  MSK - normal gait  Ext - no cyanosis, clubbing, or joint inflammation noted  Skin - no rashes, lesions, or ulcers  Neuro - normal strength, oriented x 3  Psych - normal mood and affect   Assessment/Plan:   Bronchiectasis. - stable at present - prn mucinex  Emphysema with hx of smoking. - f/u PFT to assess for COPD  Upper airway cough. - try on flonase  Laryngopharyngeal reflux. - f/u with PCP to further assess reflux  Chronic cough. - Sip water when you have the urge to  cough - Take 1 teaspoon of honey a day when you have a cough and throat irritation - Salt water gargles once or twice per day - Avoid forcing a cough or clearing your throat   Patient Instructions  Fluticasone (flonase) 1 spray in each nostril for two weeks, then as needed  Sip water when you have the urge to cough  Take 1 teaspoon of honey a day when you have a cough and throat irritation  Salt water gargles once or twice per day  Avoid forcing a cough or clearing your throat  Follow up in 2 months after your pulmonary function test is done    Chesley Mires, MD [place] [facility] Pager: [phone] 12/17/2018, 3:11 PM  Flow Sheet     Pulmonary tests:  RAST 11/27/18 >> negative  Chest imaging:  HRCT chest 12/06/18 >> mild centrilobular and paraseptal emphysema, mod cylindrical and varicoid BTX with mild progression from 2019   Medications:   Allergies as of 12/17/2018   No Known Allergies     Medication List       Accurate as of December 17, 2018  3:11 PM. If you have any questions, ask your nurse or doctor.        ALPRAZolam 0.5 MG tablet Commonly known as: XANAX Take 0.5 mg by mouth 2 (two) times daily as needed.   CALCIUM 1000 + D PO Take by mouth.  FLUoxetine 20 MG capsule Commonly known as: PROZAC 20 mg daily.   fluticasone 50 MCG/ACT nasal spray Commonly known as: FLONASE Place 1 spray into both nostrils daily. Started by: Chesley Mires, MD   levothyroxine 100 MCG tablet Commonly known as: SYNTHROID Take 88 mcg by mouth daily.   MULTIVITAMIN ADULT PO Take by mouth.   VITAMIN B 12 PO Take 1,000 mg by mouth.       Past Surgical History:  She  has a past surgical history that includes Colonoscopy (2014); Splenectomy (2001); Thyroidectomy; and Tubal ligation.  Family History:  Her family history includes Liver cancer in her father.  Social History:  She  reports that she quit smoking about 15 years ago. Her smoking use  included cigarettes. She has never used smokeless tobacco. She reports that she does not drink alcohol or use drugs.

## 2019-01-30 ENCOUNTER — Other Ambulatory Visit (HOSPITAL_COMMUNITY)
Admission: RE | Admit: 2019-01-30 | Discharge: 2019-01-30 | Disposition: A | Discharge status: Home / Self Care | Payer: Medicare Other | Source: Ambulatory Visit | Attending: Pulmonary Disease | Admitting: Pulmonary Disease

## 2019-01-30 ENCOUNTER — Other Ambulatory Visit: Payer: Self-pay

## 2019-01-30 DIAGNOSIS — Z01812 Encounter for preprocedural laboratory examination: Secondary | ICD-10-CM | POA: Insufficient documentation

## 2019-01-30 DIAGNOSIS — Z20828 Contact with and (suspected) exposure to other viral communicable diseases: Secondary | ICD-10-CM | POA: Diagnosis not present

## 2019-01-30 LAB — SARS CORONAVIRUS 2 (TAT 6-24 HRS): SARS Coronavirus 2: NEGATIVE

## 2019-02-04 ENCOUNTER — Ambulatory Visit (INDEPENDENT_AMBULATORY_CARE_PROVIDER_SITE_OTHER): Payer: Medicare PPO | Admitting: Pulmonary Disease

## 2019-02-04 ENCOUNTER — Other Ambulatory Visit: Payer: Self-pay

## 2019-02-04 DIAGNOSIS — J479 Bronchiectasis, uncomplicated: Secondary | ICD-10-CM | POA: Diagnosis not present

## 2019-02-04 LAB — PULMONARY FUNCTION TEST
DL/VA % pred: 108 %
DL/VA: 4.53 ml/min/mmHg/L
DLCO unc % pred: 92 %
DLCO unc: 16.66 ml/min/mmHg
FEF 25-75 Post: 1.45 L/sec
FEF 25-75 Pre: 1.12 L/sec
FEF2575-%Change-Post: 29 %
FEF2575-%Pred-Post: 85 %
FEF2575-%Pred-Pre: 66 %
FEV1-%Change-Post: 8 %
FEV1-%Pred-Post: 83 %
FEV1-%Pred-Pre: 76 %
FEV1-Post: 1.68 L
FEV1-Pre: 1.54 L
FEV1FVC-%Change-Post: 0 %
FEV1FVC-%Pred-Pre: 97 %
FEV6-%Change-Post: 7 %
FEV6-%Pred-Post: 88 %
FEV6-%Pred-Pre: 82 %
FEV6-Post: 2.27 L
FEV6-Pre: 2.1 L
FEV6FVC-%Change-Post: 0 %
FEV6FVC-%Pred-Post: 104 %
FEV6FVC-%Pred-Pre: 105 %
FVC-%Change-Post: 8 %
FVC-%Pred-Post: 84 %
FVC-%Pred-Pre: 78 %
FVC-Post: 2.27 L
FVC-Pre: 2.1 L
Post FEV1/FVC ratio: 74 %
Post FEV6/FVC ratio: 100 %
Pre FEV1/FVC ratio: 74 %
Pre FEV6/FVC Ratio: 100 %
RV % pred: 98 %
RV: 2.09 L
TLC % pred: 91 %
TLC: 4.35 L

## 2019-02-04 NOTE — Progress Notes (Signed)
PFT done today. 

## 2019-02-18 ENCOUNTER — Encounter: Payer: Self-pay | Admitting: Pulmonary Disease

## 2019-02-18 ENCOUNTER — Ambulatory Visit: Payer: Medicare Other | Admitting: Pulmonary Disease

## 2019-02-18 ENCOUNTER — Other Ambulatory Visit: Payer: Self-pay

## 2019-02-18 VITALS — BP 120/74 | HR 63 | Temp 97.1°F | Ht 62.0 in | Wt 115.8 lb

## 2019-02-18 DIAGNOSIS — J479 Bronchiectasis, uncomplicated: Secondary | ICD-10-CM | POA: Diagnosis not present

## 2019-02-18 DIAGNOSIS — R058 Other specified cough: Secondary | ICD-10-CM

## 2019-02-18 DIAGNOSIS — R05 Cough: Secondary | ICD-10-CM

## 2019-02-18 DIAGNOSIS — K219 Gastro-esophageal reflux disease without esophagitis: Secondary | ICD-10-CM | POA: Diagnosis not present

## 2019-02-18 NOTE — Patient Instructions (Signed)
Follow up in 1 year.

## 2019-02-18 NOTE — Progress Notes (Signed)
[facility]  Chief Complaint  Patient presents with  . Follow-up    Bronchiectasis without complication    Constitutional:  BP 120/74 (BP Location: Right Arm, Patient Position: Sitting, Cuff Size: Normal)   Pulse 63   Temp (!) 97.1 F (36.2 C)   Ht 5\' 2"  (1.575 m)   Wt 115 lb 12.8 oz (52.5 kg)   SpO2 98% Comment: on room air  BMI 21.18 kg/m   Past Medical History:  Depression, Anxiety, Hypothyroidism, Splenectomy, hiatal hernia  Brief Summary:  Madison Cox is a 74 y.o. female former smoker with cough from bronchiectasis, emphysema, post nasal drip and reflux.  She had PFT earlier this month.  Test was normal.  She has intermittent cough.  Has globus sensation.  This flares up when she gets more sinus congestion and post nasal drip.  Also still gets intermittent reflux.  Not having fever, sputum, chest pain, or wheezing.  Respiratory Exam:   Appearance - well kempt   ENMT - no sinus tenderness, no nasal discharge, no oral exudate  Respiratory - normal appearance of chest wall, normal respiratory effort w/o accessory muscle use, no dullness on percussion, no wheezing or rales  CV - s1s2 regular rate and rhythm, no murmurs, no peripheral edema, radial pulses symmetric  GI - soft, non tender  Ext - no cyanosis, clubbing, or joint inflammation noted   Assessment/Plan:   Bronchiectasis. - stable at present - prn mucinex  Emphysema with hx of smoking. - noted on CT chest; mild - no obstruction or diffusion defect on PFT - defer inhaler at this time  Upper airway cough. - prn flonase  Laryngopharyngeal reflux. - f/u with PCP to further assess reflux  Chronic cough. - Sip water when you have the urge to cough - Take 1 teaspoon of honey a day when you have a cough and throat irritation - Salt water gargles once or twice per day - Avoid forcing a cough or clearing your throat   Patient Instructions  Follow up in 1  year   A total of  24 minutes were spent face to face and non face to face time with the patient and more than half of that time involved counseling or coordination of care.   Chesley Mires, MD [facility] Pager: [phone] 02/18/2019, 12:21 PM  Flow Sheet     Pulmonary tests:  RAST 11/27/18 >> negative PFT 02/04/19 >> FEV1 1.68 (83%), FEV1% 74, TLC 4.35 (91%), DLCO 92%, no BD  Chest imaging:  HRCT chest 12/06/18 >> mild centrilobular and paraseptal emphysema, mod cylindrical and varicoid BTX with mild progression from 2019   Medications:   Allergies as of 02/18/2019   No Known Allergies     Medication List       Accurate as of February 18, 2019 12:21 PM. If you have any questions, ask your nurse or doctor.        ALPRAZolam 0.5 MG tablet Commonly known as: XANAX Take 0.5 mg by mouth daily as needed.   CALCIUM 1000 + D PO Take by mouth.   FLUoxetine 20 MG capsule Commonly known as: PROZAC 20 mg daily.   fluticasone 50 MCG/ACT nasal spray Commonly known as: FLONASE Place 1 spray into both nostrils daily.   levothyroxine 100 MCG tablet Commonly known as: SYNTHROID Take 88 mcg by mouth daily.   MULTIVITAMIN ADULT PO Take by mouth.   VITAMIN B 12 PO Take 1,000 mg by mouth.  Past Surgical History:  She  has a past surgical history that includes Colonoscopy (2014); Splenectomy (2001); Thyroidectomy; and Tubal ligation.  Family History:  Her family history includes Liver cancer in her father.  Social History:  She  reports that she quit smoking about 15 years ago. Her smoking use included cigarettes. She has never used smokeless tobacco. She reports that she does not drink alcohol or use drugs.

## 2019-06-09 DIAGNOSIS — S92901A Unspecified fracture of right foot, initial encounter for closed fracture: Secondary | ICD-10-CM | POA: Diagnosis not present

## 2019-06-09 DIAGNOSIS — M810 Age-related osteoporosis without current pathological fracture: Secondary | ICD-10-CM | POA: Diagnosis not present

## 2019-06-09 DIAGNOSIS — S92351A Displaced fracture of fifth metatarsal bone, right foot, initial encounter for closed fracture: Secondary | ICD-10-CM | POA: Diagnosis not present

## 2019-06-09 DIAGNOSIS — W109XXA Fall (on) (from) unspecified stairs and steps, initial encounter: Secondary | ICD-10-CM | POA: Diagnosis not present

## 2019-06-09 DIAGNOSIS — Z87891 Personal history of nicotine dependence: Secondary | ICD-10-CM | POA: Diagnosis not present

## 2019-06-09 DIAGNOSIS — Z79899 Other long term (current) drug therapy: Secondary | ICD-10-CM | POA: Diagnosis not present

## 2019-06-09 DIAGNOSIS — E0500 Thyrotoxicosis with diffuse goiter without thyrotoxic crisis or storm: Secondary | ICD-10-CM | POA: Diagnosis not present

## 2019-06-09 DIAGNOSIS — S8264XA Nondisplaced fracture of lateral malleolus of right fibula, initial encounter for closed fracture: Secondary | ICD-10-CM | POA: Diagnosis not present

## 2019-06-09 DIAGNOSIS — M25571 Pain in right ankle and joints of right foot: Secondary | ICD-10-CM | POA: Diagnosis not present

## 2019-06-09 DIAGNOSIS — E0590 Thyrotoxicosis, unspecified without thyrotoxic crisis or storm: Secondary | ICD-10-CM | POA: Diagnosis not present

## 2019-06-09 DIAGNOSIS — E039 Hypothyroidism, unspecified: Secondary | ICD-10-CM | POA: Diagnosis not present

## 2019-06-09 DIAGNOSIS — F329 Major depressive disorder, single episode, unspecified: Secondary | ICD-10-CM | POA: Diagnosis not present

## 2019-06-11 DIAGNOSIS — S92351A Displaced fracture of fifth metatarsal bone, right foot, initial encounter for closed fracture: Secondary | ICD-10-CM | POA: Diagnosis not present

## 2019-06-11 DIAGNOSIS — S8264XA Nondisplaced fracture of lateral malleolus of right fibula, initial encounter for closed fracture: Secondary | ICD-10-CM | POA: Diagnosis not present

## 2019-06-13 DIAGNOSIS — M810 Age-related osteoporosis without current pathological fracture: Secondary | ICD-10-CM | POA: Diagnosis not present

## 2019-07-10 DIAGNOSIS — S8264XD Nondisplaced fracture of lateral malleolus of right fibula, subsequent encounter for closed fracture with routine healing: Secondary | ICD-10-CM | POA: Diagnosis not present

## 2019-07-10 DIAGNOSIS — S92351D Displaced fracture of fifth metatarsal bone, right foot, subsequent encounter for fracture with routine healing: Secondary | ICD-10-CM | POA: Diagnosis not present

## 2019-07-25 DIAGNOSIS — S92351D Displaced fracture of fifth metatarsal bone, right foot, subsequent encounter for fracture with routine healing: Secondary | ICD-10-CM | POA: Diagnosis not present

## 2019-07-25 DIAGNOSIS — S8264XD Nondisplaced fracture of lateral malleolus of right fibula, subsequent encounter for closed fracture with routine healing: Secondary | ICD-10-CM | POA: Diagnosis not present

## 2019-08-07 ENCOUNTER — Other Ambulatory Visit: Payer: Self-pay | Admitting: Pulmonary Disease

## 2019-08-15 DIAGNOSIS — S92351D Displaced fracture of fifth metatarsal bone, right foot, subsequent encounter for fracture with routine healing: Secondary | ICD-10-CM | POA: Diagnosis not present

## 2019-08-15 DIAGNOSIS — S8264XD Nondisplaced fracture of lateral malleolus of right fibula, subsequent encounter for closed fracture with routine healing: Secondary | ICD-10-CM | POA: Diagnosis not present

## 2019-10-29 DIAGNOSIS — E039 Hypothyroidism, unspecified: Secondary | ICD-10-CM | POA: Diagnosis not present

## 2019-10-29 DIAGNOSIS — Z87891 Personal history of nicotine dependence: Secondary | ICD-10-CM | POA: Diagnosis not present

## 2019-10-29 DIAGNOSIS — F419 Anxiety disorder, unspecified: Secondary | ICD-10-CM | POA: Diagnosis not present

## 2019-10-29 DIAGNOSIS — Z299 Encounter for prophylactic measures, unspecified: Secondary | ICD-10-CM | POA: Diagnosis not present

## 2019-10-29 DIAGNOSIS — J479 Bronchiectasis, uncomplicated: Secondary | ICD-10-CM | POA: Diagnosis not present

## 2019-10-29 DIAGNOSIS — F322 Major depressive disorder, single episode, severe without psychotic features: Secondary | ICD-10-CM | POA: Diagnosis not present

## 2019-11-14 DIAGNOSIS — Z09 Encounter for follow-up examination after completed treatment for conditions other than malignant neoplasm: Secondary | ICD-10-CM | POA: Diagnosis not present

## 2019-11-14 DIAGNOSIS — S8264XD Nondisplaced fracture of lateral malleolus of right fibula, subsequent encounter for closed fracture with routine healing: Secondary | ICD-10-CM | POA: Diagnosis not present

## 2020-03-30 DIAGNOSIS — M810 Age-related osteoporosis without current pathological fracture: Secondary | ICD-10-CM | POA: Diagnosis not present

## 2020-03-30 DIAGNOSIS — K219 Gastro-esophageal reflux disease without esophagitis: Secondary | ICD-10-CM | POA: Diagnosis not present

## 2020-03-30 DIAGNOSIS — Z7722 Contact with and (suspected) exposure to environmental tobacco smoke (acute) (chronic): Secondary | ICD-10-CM | POA: Diagnosis not present

## 2020-03-30 DIAGNOSIS — F419 Anxiety disorder, unspecified: Secondary | ICD-10-CM | POA: Diagnosis not present

## 2020-03-30 DIAGNOSIS — J309 Allergic rhinitis, unspecified: Secondary | ICD-10-CM | POA: Diagnosis not present

## 2020-03-30 DIAGNOSIS — F324 Major depressive disorder, single episode, in partial remission: Secondary | ICD-10-CM | POA: Diagnosis not present

## 2020-03-30 DIAGNOSIS — E039 Hypothyroidism, unspecified: Secondary | ICD-10-CM | POA: Diagnosis not present

## 2020-03-30 DIAGNOSIS — I739 Peripheral vascular disease, unspecified: Secondary | ICD-10-CM | POA: Diagnosis not present

## 2020-03-30 DIAGNOSIS — Z79899 Other long term (current) drug therapy: Secondary | ICD-10-CM | POA: Diagnosis not present

## 2020-04-27 DIAGNOSIS — E7800 Pure hypercholesterolemia, unspecified: Secondary | ICD-10-CM | POA: Diagnosis not present

## 2020-04-27 DIAGNOSIS — E039 Hypothyroidism, unspecified: Secondary | ICD-10-CM | POA: Diagnosis not present

## 2020-04-27 DIAGNOSIS — Z6821 Body mass index (BMI) 21.0-21.9, adult: Secondary | ICD-10-CM | POA: Diagnosis not present

## 2020-04-27 DIAGNOSIS — Z1331 Encounter for screening for depression: Secondary | ICD-10-CM | POA: Diagnosis not present

## 2020-04-27 DIAGNOSIS — Z79899 Other long term (current) drug therapy: Secondary | ICD-10-CM | POA: Diagnosis not present

## 2020-04-27 DIAGNOSIS — Z1339 Encounter for screening examination for other mental health and behavioral disorders: Secondary | ICD-10-CM | POA: Diagnosis not present

## 2020-04-27 DIAGNOSIS — Z7189 Other specified counseling: Secondary | ICD-10-CM | POA: Diagnosis not present

## 2020-04-27 DIAGNOSIS — F32A Depression, unspecified: Secondary | ICD-10-CM | POA: Diagnosis not present

## 2020-04-27 DIAGNOSIS — Z299 Encounter for prophylactic measures, unspecified: Secondary | ICD-10-CM | POA: Diagnosis not present

## 2020-04-27 DIAGNOSIS — Z0000 Encounter for general adult medical examination without abnormal findings: Secondary | ICD-10-CM | POA: Diagnosis not present

## 2020-05-07 DIAGNOSIS — M810 Age-related osteoporosis without current pathological fracture: Secondary | ICD-10-CM | POA: Diagnosis not present

## 2020-05-17 DIAGNOSIS — I739 Peripheral vascular disease, unspecified: Secondary | ICD-10-CM | POA: Diagnosis not present

## 2020-10-28 DIAGNOSIS — Z23 Encounter for immunization: Secondary | ICD-10-CM | POA: Diagnosis not present

## 2020-10-28 DIAGNOSIS — F419 Anxiety disorder, unspecified: Secondary | ICD-10-CM | POA: Diagnosis not present

## 2020-10-28 DIAGNOSIS — Z299 Encounter for prophylactic measures, unspecified: Secondary | ICD-10-CM | POA: Diagnosis not present

## 2020-10-28 DIAGNOSIS — F3342 Major depressive disorder, recurrent, in full remission: Secondary | ICD-10-CM | POA: Diagnosis not present

## 2020-10-28 DIAGNOSIS — J479 Bronchiectasis, uncomplicated: Secondary | ICD-10-CM | POA: Diagnosis not present

## 2020-10-28 DIAGNOSIS — E039 Hypothyroidism, unspecified: Secondary | ICD-10-CM | POA: Diagnosis not present

## 2020-11-09 ENCOUNTER — Other Ambulatory Visit: Payer: Self-pay | Admitting: Internal Medicine

## 2020-11-09 ENCOUNTER — Other Ambulatory Visit: Payer: Self-pay

## 2020-11-09 ENCOUNTER — Ambulatory Visit
Admission: RE | Admit: 2020-11-09 | Discharge: 2020-11-09 | Disposition: A | Discharge status: Home / Self Care | Payer: Medicare PPO | Source: Ambulatory Visit | Attending: Internal Medicine | Admitting: Internal Medicine

## 2020-11-09 DIAGNOSIS — Z1231 Encounter for screening mammogram for malignant neoplasm of breast: Secondary | ICD-10-CM

## 2020-12-01 DIAGNOSIS — H40033 Anatomical narrow angle, bilateral: Secondary | ICD-10-CM | POA: Diagnosis not present

## 2020-12-01 DIAGNOSIS — H04123 Dry eye syndrome of bilateral lacrimal glands: Secondary | ICD-10-CM | POA: Diagnosis not present

## 2021-03-14 IMAGING — DX DG CHEST 2V
2 series · 2 of 2 positions shown · non-contrast
Comparison: 12/17/2007 chest radiograph.

CLINICAL DATA: Cough, bronchiectasis

EXAM:
CHEST - 2 VIEW

[chest pa]
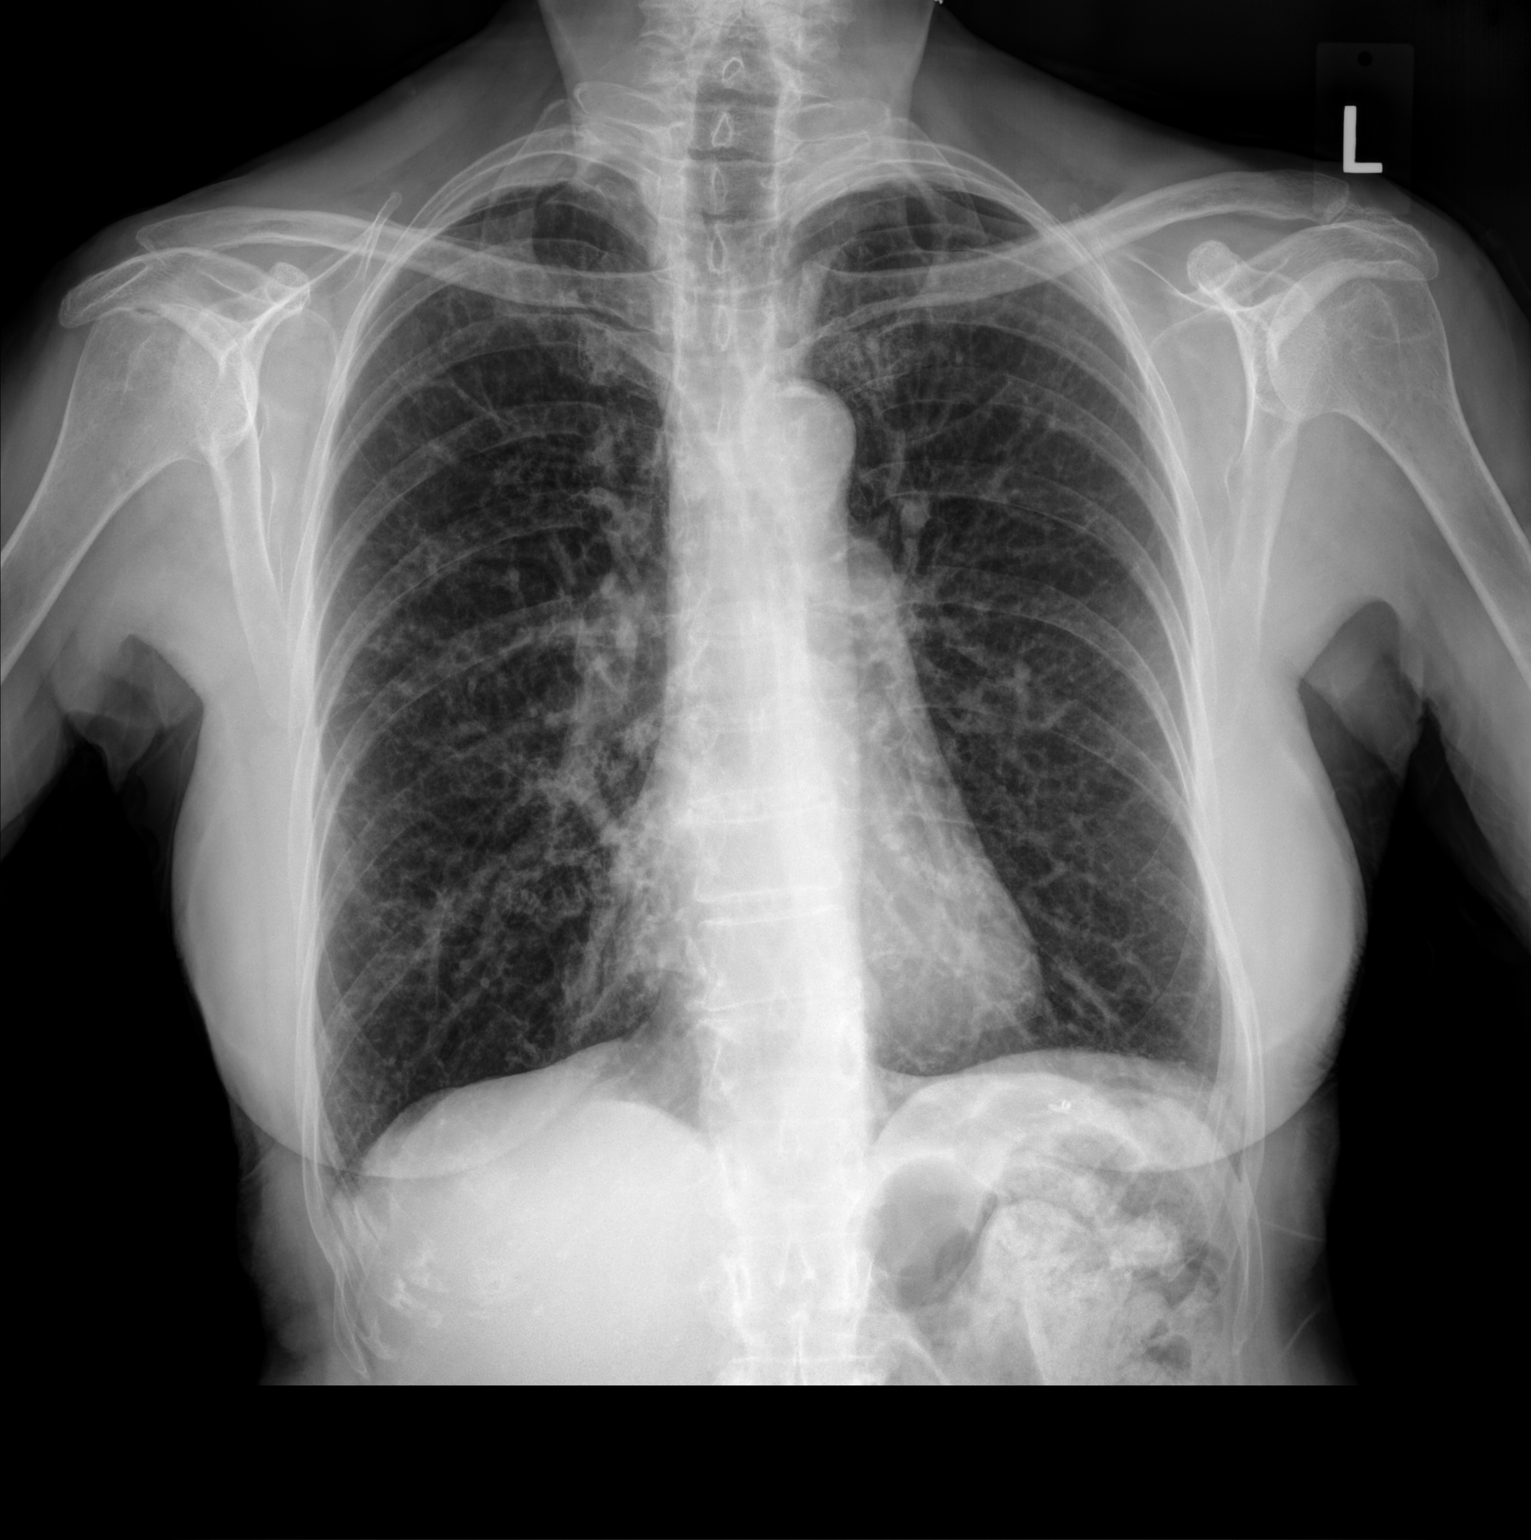

[chest lat]
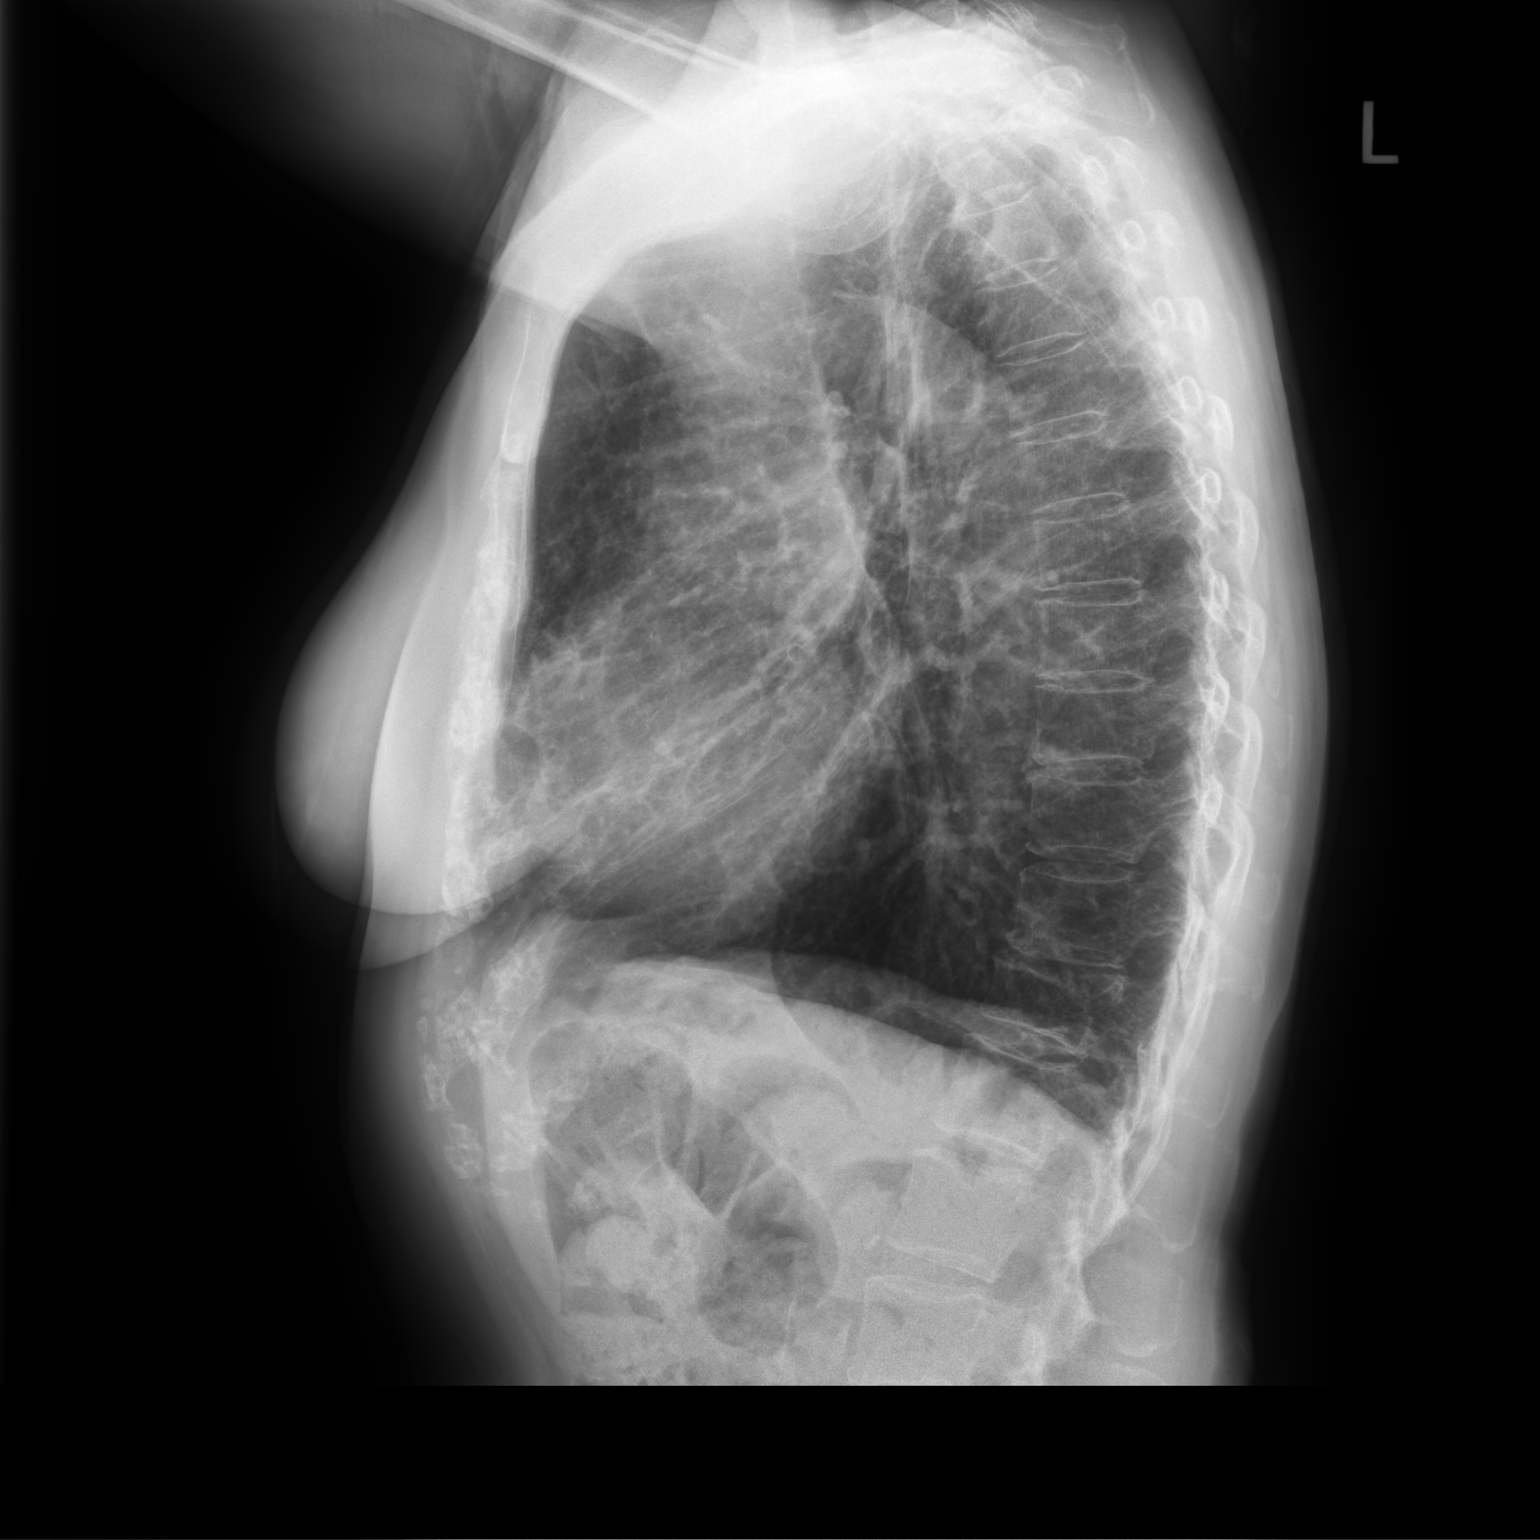

[2 of 2 positions shown; findings below may reference images not displayed]

FINDINGS: Stable cardiomediastinal silhouette with normal heart size. No
pneumothorax. No pleural effusion. No pulmonary edema. No acute
consolidative airspace disease. Scattered tram track opacities
throughout both lungs, most prominent in the right middle lobe and
lingula, appearing slightly worsened since 02/15/2017. Hyperinflated
lungs. Chronic lower thoracic vertebral compression fracture.
IMPRESSION: 1. No acute consolidative airspace disease to suggest a pneumonia.
2. Scattered tram track opacities in both lungs, most prominent in
the right middle lobe and lingula, appearing slightly worsened,
compatible with the reported history of bronchiectasis.
High-resolution chest CT study may be obtained for further
evaluation as clinically warranted.
3. Hyperinflated lungs, unchanged, suggesting chronic obstructive
lung disease.

## 2021-05-03 DIAGNOSIS — Z0000 Encounter for general adult medical examination without abnormal findings: Secondary | ICD-10-CM | POA: Diagnosis not present

## 2021-05-03 DIAGNOSIS — M810 Age-related osteoporosis without current pathological fracture: Secondary | ICD-10-CM | POA: Diagnosis not present

## 2021-05-03 DIAGNOSIS — Z6820 Body mass index (BMI) 20.0-20.9, adult: Secondary | ICD-10-CM | POA: Diagnosis not present

## 2021-05-03 DIAGNOSIS — Z1331 Encounter for screening for depression: Secondary | ICD-10-CM | POA: Diagnosis not present

## 2021-05-03 DIAGNOSIS — E039 Hypothyroidism, unspecified: Secondary | ICD-10-CM | POA: Diagnosis not present

## 2021-05-03 DIAGNOSIS — Z7189 Other specified counseling: Secondary | ICD-10-CM | POA: Diagnosis not present

## 2021-05-03 DIAGNOSIS — Z1339 Encounter for screening examination for other mental health and behavioral disorders: Secondary | ICD-10-CM | POA: Diagnosis not present

## 2021-05-03 DIAGNOSIS — E7800 Pure hypercholesterolemia, unspecified: Secondary | ICD-10-CM | POA: Diagnosis not present

## 2021-05-03 DIAGNOSIS — Z299 Encounter for prophylactic measures, unspecified: Secondary | ICD-10-CM | POA: Diagnosis not present

## 2021-05-03 DIAGNOSIS — R5383 Other fatigue: Secondary | ICD-10-CM | POA: Diagnosis not present

## 2021-05-03 DIAGNOSIS — Z79899 Other long term (current) drug therapy: Secondary | ICD-10-CM | POA: Diagnosis not present

## 2021-05-18 DIAGNOSIS — M810 Age-related osteoporosis without current pathological fracture: Secondary | ICD-10-CM | POA: Diagnosis not present

## 2021-05-27 DIAGNOSIS — M859 Disorder of bone density and structure, unspecified: Secondary | ICD-10-CM | POA: Diagnosis not present

## 2021-05-27 DIAGNOSIS — Z79899 Other long term (current) drug therapy: Secondary | ICD-10-CM | POA: Diagnosis not present

## 2021-05-27 DIAGNOSIS — E2839 Other primary ovarian failure: Secondary | ICD-10-CM | POA: Diagnosis not present

## 2021-11-10 DIAGNOSIS — Z23 Encounter for immunization: Secondary | ICD-10-CM | POA: Diagnosis not present

## 2021-11-10 DIAGNOSIS — F1320 Sedative, hypnotic or anxiolytic dependence, uncomplicated: Secondary | ICD-10-CM | POA: Diagnosis not present

## 2021-11-10 DIAGNOSIS — E039 Hypothyroidism, unspecified: Secondary | ICD-10-CM | POA: Diagnosis not present

## 2021-11-10 DIAGNOSIS — F3342 Major depressive disorder, recurrent, in full remission: Secondary | ICD-10-CM | POA: Diagnosis not present

## 2021-11-10 DIAGNOSIS — F419 Anxiety disorder, unspecified: Secondary | ICD-10-CM | POA: Diagnosis not present

## 2021-11-10 DIAGNOSIS — Z789 Other specified health status: Secondary | ICD-10-CM | POA: Diagnosis not present

## 2021-11-10 DIAGNOSIS — Z299 Encounter for prophylactic measures, unspecified: Secondary | ICD-10-CM | POA: Diagnosis not present

## 2021-11-14 DIAGNOSIS — M810 Age-related osteoporosis without current pathological fracture: Secondary | ICD-10-CM | POA: Diagnosis not present

## 2022-05-09 ENCOUNTER — Ambulatory Visit
Admission: RE | Admit: 2022-05-09 | Discharge: 2022-05-09 | Disposition: A | Discharge status: Home / Self Care | Payer: Medicare PPO | Source: Ambulatory Visit | Attending: Nurse Practitioner | Admitting: Nurse Practitioner

## 2022-05-09 ENCOUNTER — Other Ambulatory Visit: Payer: Self-pay | Admitting: Nurse Practitioner

## 2022-05-09 DIAGNOSIS — Z1231 Encounter for screening mammogram for malignant neoplasm of breast: Secondary | ICD-10-CM

## 2022-05-09 DIAGNOSIS — R5383 Other fatigue: Secondary | ICD-10-CM | POA: Diagnosis not present

## 2022-05-09 DIAGNOSIS — E039 Hypothyroidism, unspecified: Secondary | ICD-10-CM | POA: Diagnosis not present

## 2022-05-09 DIAGNOSIS — Z299 Encounter for prophylactic measures, unspecified: Secondary | ICD-10-CM | POA: Diagnosis not present

## 2022-05-09 DIAGNOSIS — Z1339 Encounter for screening examination for other mental health and behavioral disorders: Secondary | ICD-10-CM | POA: Diagnosis not present

## 2022-05-09 DIAGNOSIS — Z0000 Encounter for general adult medical examination without abnormal findings: Secondary | ICD-10-CM | POA: Diagnosis not present

## 2022-05-09 DIAGNOSIS — F3342 Major depressive disorder, recurrent, in full remission: Secondary | ICD-10-CM | POA: Diagnosis not present

## 2022-05-09 DIAGNOSIS — Z7189 Other specified counseling: Secondary | ICD-10-CM | POA: Diagnosis not present

## 2022-05-09 DIAGNOSIS — Z79899 Other long term (current) drug therapy: Secondary | ICD-10-CM | POA: Diagnosis not present

## 2022-05-09 DIAGNOSIS — M810 Age-related osteoporosis without current pathological fracture: Secondary | ICD-10-CM | POA: Diagnosis not present

## 2022-05-09 DIAGNOSIS — E7800 Pure hypercholesterolemia, unspecified: Secondary | ICD-10-CM | POA: Diagnosis not present

## 2022-05-09 DIAGNOSIS — Z1331 Encounter for screening for depression: Secondary | ICD-10-CM | POA: Diagnosis not present

## 2022-06-15 DIAGNOSIS — K5900 Constipation, unspecified: Secondary | ICD-10-CM | POA: Diagnosis not present

## 2022-06-15 DIAGNOSIS — Z1211 Encounter for screening for malignant neoplasm of colon: Secondary | ICD-10-CM | POA: Diagnosis not present

## 2022-07-04 DIAGNOSIS — M810 Age-related osteoporosis without current pathological fracture: Secondary | ICD-10-CM | POA: Diagnosis not present

## 2022-11-14 DIAGNOSIS — I700 Atherosclerosis of aorta: Secondary | ICD-10-CM | POA: Diagnosis not present

## 2022-11-14 DIAGNOSIS — F419 Anxiety disorder, unspecified: Secondary | ICD-10-CM | POA: Diagnosis not present

## 2022-11-14 DIAGNOSIS — E039 Hypothyroidism, unspecified: Secondary | ICD-10-CM | POA: Diagnosis not present

## 2022-11-14 DIAGNOSIS — F3342 Major depressive disorder, recurrent, in full remission: Secondary | ICD-10-CM | POA: Diagnosis not present

## 2022-11-14 DIAGNOSIS — Z0000 Encounter for general adult medical examination without abnormal findings: Secondary | ICD-10-CM | POA: Diagnosis not present

## 2022-11-14 DIAGNOSIS — F1320 Sedative, hypnotic or anxiolytic dependence, uncomplicated: Secondary | ICD-10-CM | POA: Diagnosis not present

## 2022-11-14 DIAGNOSIS — Z299 Encounter for prophylactic measures, unspecified: Secondary | ICD-10-CM | POA: Diagnosis not present

## 2022-11-14 DIAGNOSIS — Z23 Encounter for immunization: Secondary | ICD-10-CM | POA: Diagnosis not present

## 2023-01-01 DIAGNOSIS — M810 Age-related osteoporosis without current pathological fracture: Secondary | ICD-10-CM | POA: Diagnosis not present

## 2023-02-25 IMAGING — MG MM DIGITAL SCREENING BILAT W/ TOMO AND CAD
8 series · 9 of 24 positions shown · non-contrast
Comparison: Previous exam(s).

CLINICAL DATA: Screening.

EXAM:
DIGITAL SCREENING BILATERAL MAMMOGRAM WITH TOMOSYNTHESIS AND CAD
TECHNIQUE: Bilateral screening digital craniocaudal and mediolateral oblique
mammograms were obtained. Bilateral screening digital breast
tomosynthesis was performed. The images were evaluated with
computer-aided detection.

[L CC synth-2D]
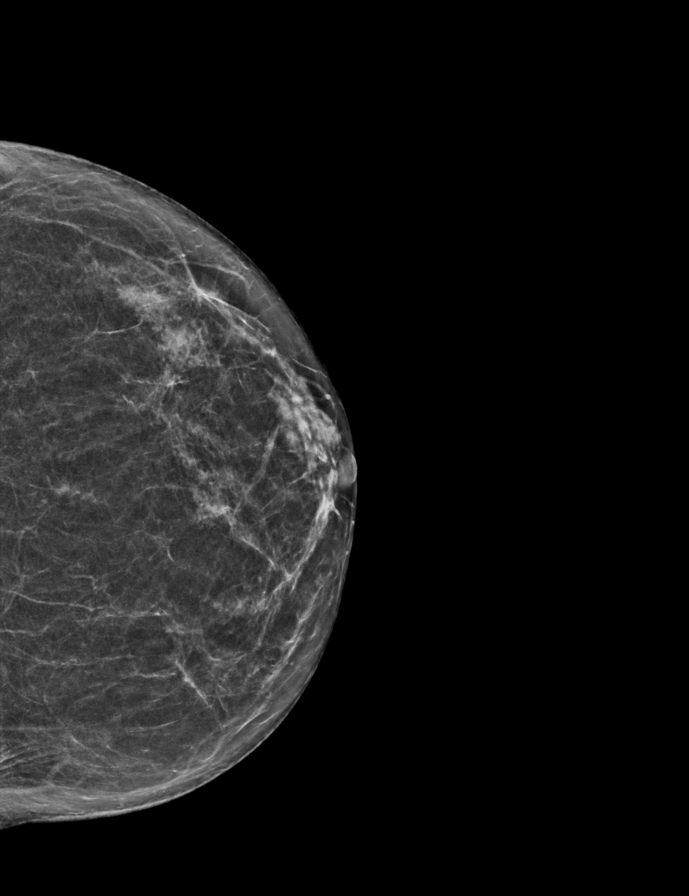

[L MLO synth-2D]
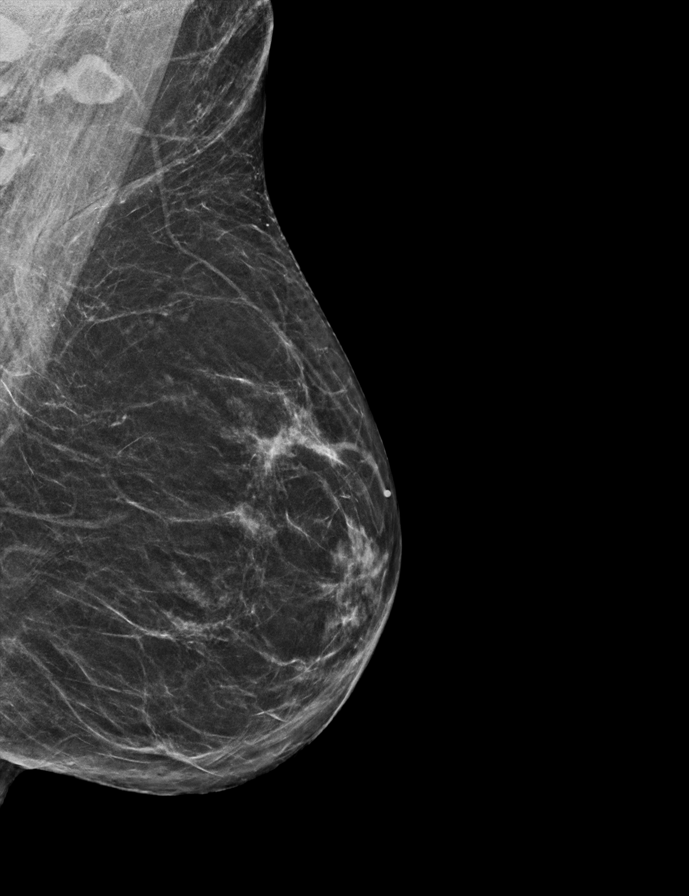

[R CC synth-2D]
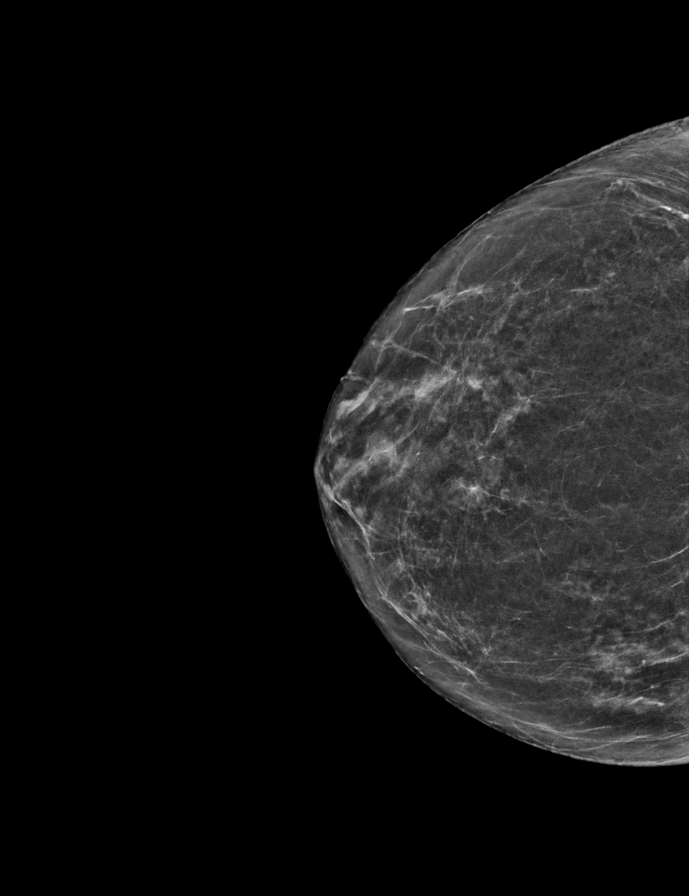

[R MLO synth-2D]
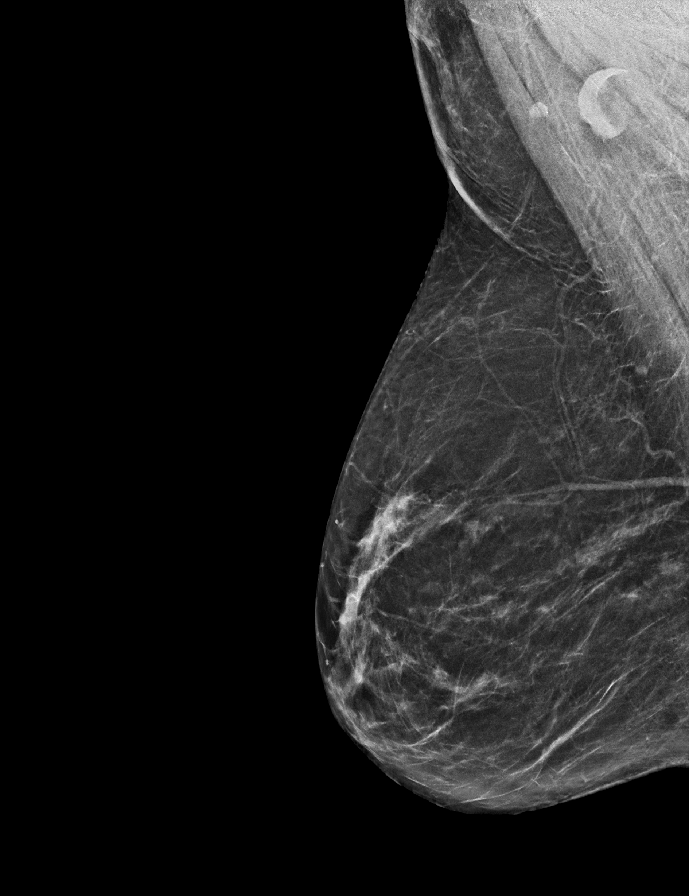

[L MLO tomo · 2 of 54 frames shown]
[frame 18/54]
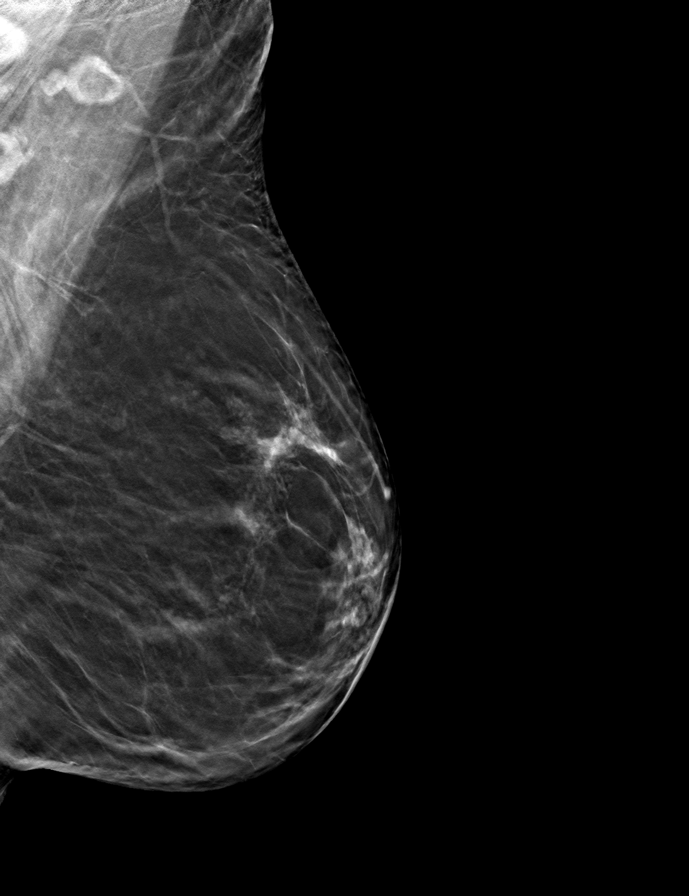
[frame 27/54]
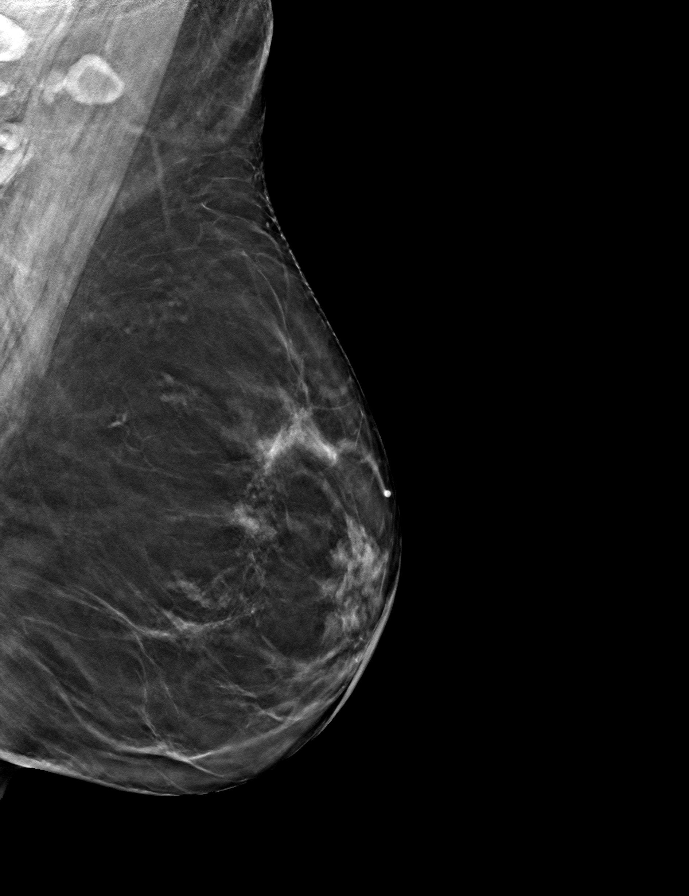

[L CC tomo · tomo slice 25/50.0]
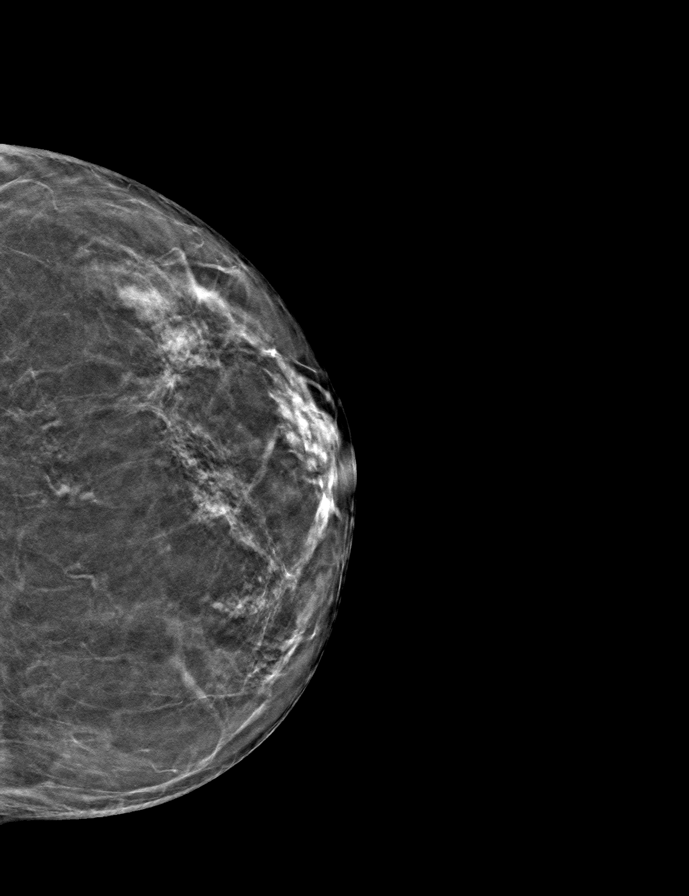

[R CC tomo · tomo slice 26/51.0]
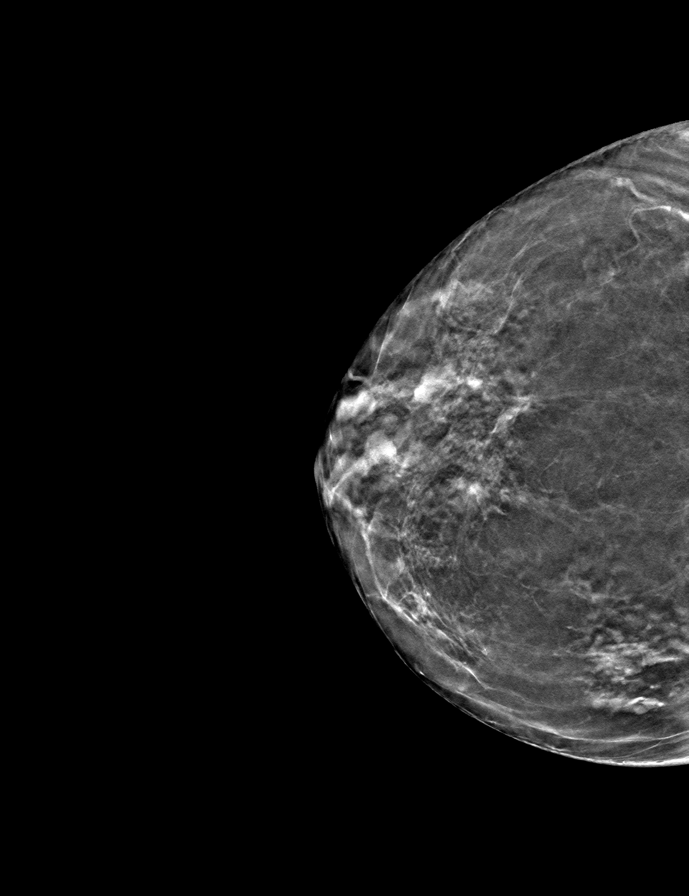

[R MLO tomo · tomo slice 29/57.0]
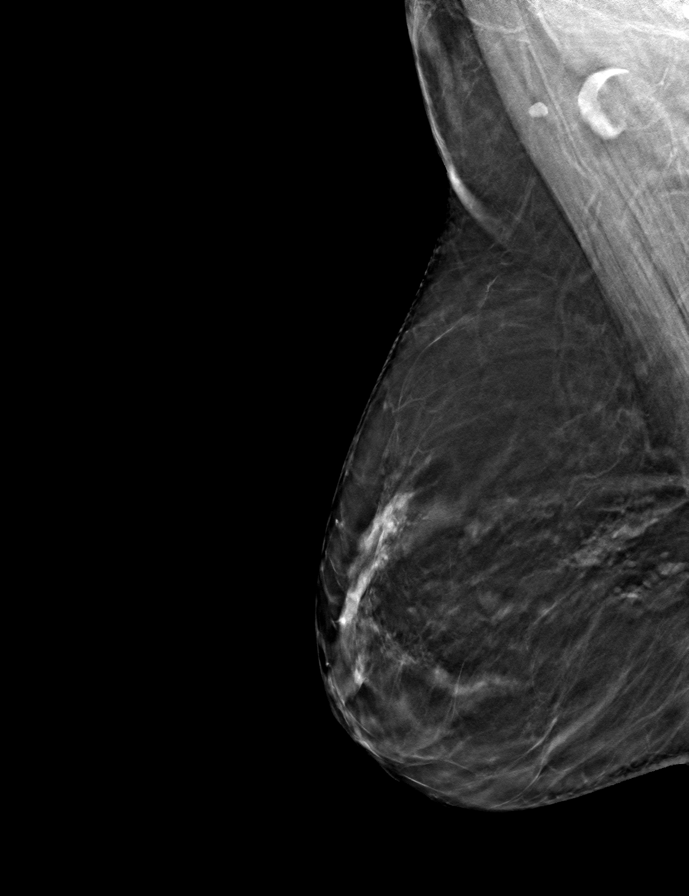

[9 of 24 positions shown; findings below may reference images not displayed]

ACR Breast Density Category b: There are scattered areas of
fibroglandular density.
FINDINGS: There are no findings suspicious for malignancy.
IMPRESSION: No mammographic evidence of malignancy. A result letter of this
screening mammogram will be mailed directly to the patient.

RECOMMENDATION:
Screening mammogram in one year. (Code:51-O-LD2)

BI-RADS CATEGORY  1: Negative.

## 2023-04-28 DIAGNOSIS — H2513 Age-related nuclear cataract, bilateral: Secondary | ICD-10-CM | POA: Diagnosis not present

## 2023-05-07 DIAGNOSIS — H353131 Nonexudative age-related macular degeneration, bilateral, early dry stage: Secondary | ICD-10-CM | POA: Diagnosis not present

## 2023-05-07 DIAGNOSIS — H18523 Epithelial (juvenile) corneal dystrophy, bilateral: Secondary | ICD-10-CM | POA: Diagnosis not present

## 2023-05-07 DIAGNOSIS — H25813 Combined forms of age-related cataract, bilateral: Secondary | ICD-10-CM | POA: Diagnosis not present

## 2023-05-16 DIAGNOSIS — Z0000 Encounter for general adult medical examination without abnormal findings: Secondary | ICD-10-CM | POA: Diagnosis not present

## 2023-05-16 DIAGNOSIS — M810 Age-related osteoporosis without current pathological fracture: Secondary | ICD-10-CM | POA: Diagnosis not present

## 2023-05-16 DIAGNOSIS — R5383 Other fatigue: Secondary | ICD-10-CM | POA: Diagnosis not present

## 2023-05-16 DIAGNOSIS — Z1339 Encounter for screening examination for other mental health and behavioral disorders: Secondary | ICD-10-CM | POA: Diagnosis not present

## 2023-05-16 DIAGNOSIS — Z79899 Other long term (current) drug therapy: Secondary | ICD-10-CM | POA: Diagnosis not present

## 2023-05-16 DIAGNOSIS — Z1331 Encounter for screening for depression: Secondary | ICD-10-CM | POA: Diagnosis not present

## 2023-05-16 DIAGNOSIS — E7800 Pure hypercholesterolemia, unspecified: Secondary | ICD-10-CM | POA: Diagnosis not present

## 2023-05-16 DIAGNOSIS — E039 Hypothyroidism, unspecified: Secondary | ICD-10-CM | POA: Diagnosis not present

## 2023-05-16 DIAGNOSIS — Z7189 Other specified counseling: Secondary | ICD-10-CM | POA: Diagnosis not present

## 2023-05-16 DIAGNOSIS — F3342 Major depressive disorder, recurrent, in full remission: Secondary | ICD-10-CM | POA: Diagnosis not present

## 2023-05-16 DIAGNOSIS — Z299 Encounter for prophylactic measures, unspecified: Secondary | ICD-10-CM | POA: Diagnosis not present

## 2023-06-07 DIAGNOSIS — H25811 Combined forms of age-related cataract, right eye: Secondary | ICD-10-CM | POA: Diagnosis not present

## 2023-06-07 DIAGNOSIS — E039 Hypothyroidism, unspecified: Secondary | ICD-10-CM | POA: Diagnosis not present

## 2023-06-07 DIAGNOSIS — F418 Other specified anxiety disorders: Secondary | ICD-10-CM | POA: Diagnosis not present

## 2023-06-21 DIAGNOSIS — F419 Anxiety disorder, unspecified: Secondary | ICD-10-CM | POA: Diagnosis not present

## 2023-06-21 DIAGNOSIS — H25812 Combined forms of age-related cataract, left eye: Secondary | ICD-10-CM | POA: Diagnosis not present

## 2023-06-28 DIAGNOSIS — H20013 Primary iridocyclitis, bilateral: Secondary | ICD-10-CM | POA: Diagnosis not present

## 2023-07-02 DIAGNOSIS — E2839 Other primary ovarian failure: Secondary | ICD-10-CM | POA: Diagnosis not present

## 2023-07-19 DIAGNOSIS — H04123 Dry eye syndrome of bilateral lacrimal glands: Secondary | ICD-10-CM | POA: Diagnosis not present

## 2023-08-09 DIAGNOSIS — H10013 Acute follicular conjunctivitis, bilateral: Secondary | ICD-10-CM | POA: Diagnosis not present

## 2023-08-09 DIAGNOSIS — Z0100 Encounter for examination of eyes and vision without abnormal findings: Secondary | ICD-10-CM | POA: Diagnosis not present

## 2023-11-27 DIAGNOSIS — Z299 Encounter for prophylactic measures, unspecified: Secondary | ICD-10-CM | POA: Diagnosis not present

## 2023-11-27 DIAGNOSIS — F1320 Sedative, hypnotic or anxiolytic dependence, uncomplicated: Secondary | ICD-10-CM | POA: Diagnosis not present

## 2023-11-27 DIAGNOSIS — Z23 Encounter for immunization: Secondary | ICD-10-CM | POA: Diagnosis not present

## 2023-11-27 DIAGNOSIS — F419 Anxiety disorder, unspecified: Secondary | ICD-10-CM | POA: Diagnosis not present

## 2023-11-27 DIAGNOSIS — Z0000 Encounter for general adult medical examination without abnormal findings: Secondary | ICD-10-CM | POA: Diagnosis not present

## 2023-11-27 DIAGNOSIS — E039 Hypothyroidism, unspecified: Secondary | ICD-10-CM | POA: Diagnosis not present

## 2023-12-25 DIAGNOSIS — E039 Hypothyroidism, unspecified: Secondary | ICD-10-CM | POA: Diagnosis not present
